# Patient Record
Sex: Male | Born: 1985
Health system: Southern US, Community
[De-identification: ages and names within clinical notes are randomized; demographics above are authoritative.]

---

## 2009-11-27 ENCOUNTER — Emergency Department (HOSPITAL_BASED_OUTPATIENT_CLINIC_OR_DEPARTMENT_OTHER): Admission: EM | Admit: 2009-11-27 | Discharge: 2009-11-27 | Payer: Self-pay | Admitting: Emergency Medicine

## 2011-01-14 ENCOUNTER — Encounter: Payer: Self-pay | Admitting: Family Medicine

## 2011-01-14 ENCOUNTER — Emergency Department (HOSPITAL_BASED_OUTPATIENT_CLINIC_OR_DEPARTMENT_OTHER)
Admission: EM | Admit: 2011-01-14 | Discharge: 2011-01-14 | Disposition: A | Discharge status: Home / Self Care | Payer: Self-pay | Attending: Emergency Medicine | Admitting: Emergency Medicine

## 2011-01-14 DIAGNOSIS — H109 Unspecified conjunctivitis: Secondary | ICD-10-CM | POA: Insufficient documentation

## 2011-01-14 DIAGNOSIS — H5789 Other specified disorders of eye and adnexa: Secondary | ICD-10-CM | POA: Insufficient documentation

## 2011-01-14 DIAGNOSIS — L738 Other specified follicular disorders: Secondary | ICD-10-CM | POA: Insufficient documentation

## 2011-01-14 DIAGNOSIS — F17200 Nicotine dependence, unspecified, uncomplicated: Secondary | ICD-10-CM | POA: Insufficient documentation

## 2011-01-14 NOTE — ED Notes (Signed)
Patient states he came in for eye redness and pain, crusted eye lids in morning. Patient states he also needs some "boils" checked on his legs. Patient vomited on floor in waiting room.

## 2011-01-14 NOTE — ED Notes (Signed)
Pt reports having "real bad allergies". Pt denies blurred vision at this time.

## 2011-01-14 NOTE — ED Notes (Signed)
Pt chief complaint of "eye drainage and being crusty in the mornings with occasional blurred vision and pain". Pt also sts he has had nausea "all day" and finally vomited. Pt observed "drinking large amount of water in the lobby and then vomited immediately after". Pt also sts  "bumps on the inside of my thighs I want checked".

## 2011-01-14 NOTE — ED Provider Notes (Signed)
History   CC: eye discharge  CSN: [id number] Arrival date & time: 01/14/2011 10:51 AM  Chief Complaint  Patient presents with  . Eye Drainage   HPI Well young male presenting with 1 week of eye complaints. He was in his usual state of health prior to the onset of symptoms. He notes a day of having a sore throat, then the subsequent development of bilateral eye discomfort. He describes a.m. "crustiness" and persistent like discharge, with occasional blurry vision. He has not had anything to resolve the symptoms. He also notes some amounts of nausea, including her before in the evaluation, when he had one episode of vomitus. Patient also would like bilateral thigh folliculitis checked out. He denies any fevers, chills, mental status changes, cough, chest pain, shortness of breath, abdominal pain, diarrhea, dysuria throughout the course of illness History reviewed. No pertinent past medical history.  History reviewed. No pertinent past surgical history.  No family history on file.  History  Substance Use Topics  . Smoking status: Current Everyday Smoker    Types: Cigarettes  . Smokeless tobacco: Not on file  . Alcohol Use: No      Review of Systems  Constitutional: Negative for fever and chills.  HENT: Positive for sore throat.   Eyes: Negative for visual disturbance.  Respiratory: Negative for shortness of breath.   Cardiovascular: Negative for chest pain.  Gastrointestinal: Negative for nausea.  Genitourinary: Negative for dysuria.  Musculoskeletal: Negative for myalgias.  Skin: Positive for rash.  Neurological: Negative for headaches.  Psychiatric/Behavioral: Negative.     Physical Exam  BP 113/77  Pulse 55  Temp(Src) 97.8 F (36.6 C) (Oral)  Resp 20  SpO2 100%  Physical Exam  Constitutional: He is oriented to person, place, and time. He appears well-developed and well-nourished.  HENT:  Head: Normocephalic and atraumatic.  Mouth/Throat: Oropharynx is clear and  moist. Mucous membranes are not pale and not dry. Normal dentition. No oropharyngeal exudate.  Eyes: EOM are normal. Pupils are equal, round, and reactive to light. Right eye exhibits discharge. Right eye exhibits no chemosis and no exudate. Left eye exhibits discharge. Left eye exhibits no chemosis and no exudate. Right conjunctiva is injected. Left conjunctiva is injected. No scleral icterus.  Neck: Neck supple.  Cardiovascular: Normal rate and regular rhythm.   Pulmonary/Chest: No respiratory distress.  Abdominal: Soft. There is no tenderness.  Musculoskeletal: He exhibits no edema.  Neurological: He is alert and oriented to person, place, and time.  Skin:       Bilateral folliculitis about inner thighs. No erythema, no gross discharge, no gross signs of infection systemically.  Psychiatric: He has a normal mood and affect.    ED Course  Procedures  MDM Well young male presenting now with one week of bilateral eye discharge and crustiness, consistent with conjunctivitis. The onset of symptoms following a sore throat is further reassuring of this. The patient will have his visual acuities checked. These are normal, he will be discharged with structures use Visine for comfort. Absent fevers, signs of systemic illness, or other focal findings, this is unlikely to be bacterial conjunctivitis. The patient notes no history with a primary care physician, but will given referral for one, with explicit instructions to followup if no improvement in 2-3 days, or if he develops any new concerning symptoms.      Gerhard Munch, MD 01/14/11 1155

## 2011-11-04 ENCOUNTER — Encounter (HOSPITAL_BASED_OUTPATIENT_CLINIC_OR_DEPARTMENT_OTHER): Payer: Self-pay | Admitting: Family Medicine

## 2011-11-04 ENCOUNTER — Emergency Department (HOSPITAL_BASED_OUTPATIENT_CLINIC_OR_DEPARTMENT_OTHER)
Admission: EM | Admit: 2011-11-04 | Discharge: 2011-11-04 | Disposition: A | Discharge status: Home / Self Care | Payer: Self-pay | Attending: Emergency Medicine | Admitting: Emergency Medicine

## 2011-11-04 DIAGNOSIS — R112 Nausea with vomiting, unspecified: Secondary | ICD-10-CM | POA: Insufficient documentation

## 2011-11-04 DIAGNOSIS — F17200 Nicotine dependence, unspecified, uncomplicated: Secondary | ICD-10-CM | POA: Insufficient documentation

## 2011-11-04 DIAGNOSIS — R109 Unspecified abdominal pain: Secondary | ICD-10-CM | POA: Insufficient documentation

## 2011-11-04 DIAGNOSIS — K529 Noninfective gastroenteritis and colitis, unspecified: Secondary | ICD-10-CM

## 2011-11-04 LAB — DIFFERENTIAL
Basophils Relative: 0 % (ref 0–1)
Lymphocytes Relative: 44 % (ref 12–46)
Lymphs Abs: 2.2 10*3/uL (ref 0.7–4.0)
Monocytes Absolute: 0.5 10*3/uL (ref 0.1–1.0)
Monocytes Relative: 11 % (ref 3–12)
Neutro Abs: 2.2 10*3/uL (ref 1.7–7.7)
Neutrophils Relative %: 43 % (ref 43–77)

## 2011-11-04 LAB — CBC
HCT: 42.7 % (ref 39.0–52.0)
Hemoglobin: 15.3 g/dL (ref 13.0–17.0)
RBC: 4.9 MIL/uL (ref 4.22–5.81)
WBC: 5 10*3/uL (ref 4.0–10.5)

## 2011-11-04 LAB — URINALYSIS, ROUTINE W REFLEX MICROSCOPIC
Glucose, UA: NEGATIVE mg/dL
Ketones, ur: NEGATIVE mg/dL
Leukocytes, UA: NEGATIVE
Nitrite: NEGATIVE
Specific Gravity, Urine: 1.024 (ref 1.005–1.030)
pH: 7.5 (ref 5.0–8.0)

## 2011-11-04 LAB — COMPREHENSIVE METABOLIC PANEL
Albumin: 3.7 g/dL (ref 3.5–5.2)
Alkaline Phosphatase: 61 U/L (ref 39–117)
BUN: 10 mg/dL (ref 6–23)
CO2: 32 mEq/L (ref 19–32)
Chloride: 103 mEq/L (ref 96–112)
Creatinine, Ser: 0.9 mg/dL (ref 0.50–1.35)
GFR calc non Af Amer: >90 mL/min (ref >90)
Potassium: 3.8 mEq/L (ref 3.5–5.1)
Total Bilirubin: 0.4 mg/dL (ref 0.3–1.2)

## 2011-11-04 MED ORDER — SODIUM CHLORIDE 0.9 % IV SOLN
Freq: Once | INTRAVENOUS | Status: AC
  Administered 2011-11-04: 10:00:00 via INTRAVENOUS

## 2011-11-04 MED ORDER — ONDANSETRON HCL 4 MG/2ML IJ SOLN
4.0000 mg | Freq: Once | INTRAMUSCULAR | Status: AC
  Administered 2011-11-04: 4 mg via INTRAVENOUS
  Filled 2011-11-04: qty 2

## 2011-11-04 MED ORDER — KETOROLAC TROMETHAMINE 30 MG/ML IJ SOLN
30.0000 mg | Freq: Once | INTRAMUSCULAR | Status: AC
  Administered 2011-11-04: 30 mg via INTRAVENOUS
  Filled 2011-11-04: qty 1

## 2011-11-04 NOTE — ED Provider Notes (Signed)
History     CSN: [id number]  Arrival date & time 11/04/11  4098   First MD Initiated Contact with Patient 11/04/11 [phone]      Chief Complaint  Patient presents with  . Abdominal Pain    (Consider location/radiation/quality/duration/timing/severity/associated sxs/prior treatment) HPI Comments: Woke this am with n/v, abd cramping.  Friend's girlfriend is sick with the same.  He denies fevers or chills.  No urinary complaints.  Patient is a 26 y.o. male presenting with abdominal pain. The history is provided by the patient.  Abdominal Pain The primary symptoms of the illness include abdominal pain, nausea and vomiting. The primary symptoms of the illness do not include fever, diarrhea or dysuria. Episode onset: this morning. The onset of the illness was sudden. The problem has been gradually worsening.  The patient has not had a change in bowel habit. Symptoms associated with the illness do not include chills, constipation, urgency, frequency or back pain. Associated medical issues comments: none.    History reviewed. No pertinent past medical history.  History reviewed. No pertinent past surgical history.  No family history on file.  History  Substance Use Topics  . Smoking status: Current Everyday Smoker    Types: Cigarettes  . Smokeless tobacco: Not on file  . Alcohol Use: No      Review of Systems  Constitutional: Negative for fever and chills.  Gastrointestinal: Positive for nausea, vomiting and abdominal pain. Negative for diarrhea and constipation.  Genitourinary: Negative for dysuria, urgency and frequency.  Musculoskeletal: Negative for back pain.  All other systems reviewed and are negative.    Allergies  Review of patient's allergies indicates no known allergies.  Home Medications  No current outpatient prescriptions on file.  BP 119/69  Pulse 55  Temp 97.6 F (36.4 C) (Oral)  Resp 16  Ht 5\' 10"  (1.778 m)  Wt 175 lb (79.379 kg)  BMI 25.11 kg/m2   SpO2 99%  Physical Exam  Nursing note and vitals reviewed. Constitutional: He is oriented to person, place, and time. He appears well-developed and well-nourished. No distress.  HENT:  Head: Normocephalic and atraumatic.  Neck: Normal range of motion. Neck supple.  Cardiovascular: Normal rate.   No murmur heard. Pulmonary/Chest: Effort normal and breath sounds normal.  Abdominal: Soft. Bowel sounds are normal. He exhibits no distension.       There is mild ttp in all four quadrants without rebound or guarding.  Neurological: He is alert and oriented to person, place, and time.  Skin: Skin is warm and dry. He is not diaphoretic.    ED Course  Procedures (including critical care time)   Labs Reviewed  URINALYSIS, ROUTINE W REFLEX MICROSCOPIC   No results found.   No diagnosis found.    MDM  Labs look okay.  Symptoms most likely viral in nature.  Will discharge with time, increased fluids.  Return prn.          Geoffery Lyons, MD 11/04/11 1055

## 2011-11-04 NOTE — ED Notes (Signed)
Pt c/o lower abdominal pain since this morning and 1 episode of vomiting. Pt sts girlfriend has also been sick.

## 2011-11-04 NOTE — Discharge Instructions (Signed)
Viral Gastroenteritis Viral gastroenteritis is also known as stomach flu. This condition affects the stomach and intestinal tract. It can cause sudden diarrhea and vomiting. The illness typically lasts 3 to 8 days. Most people develop an immune response that eventually gets rid of the virus. While this natural response develops, the virus can make you quite ill. CAUSES  Many different viruses can cause gastroenteritis, such as rotavirus or noroviruses. You can catch one of these viruses by consuming contaminated food or water. You may also catch a virus by sharing utensils or other personal items with an infected person or by touching a contaminated surface. SYMPTOMS  The most common symptoms are diarrhea and vomiting. These problems can cause a severe loss of body fluids (dehydration) and a body salt (electrolyte) imbalance. Other symptoms may include:  Fever.   Headache.   Fatigue.   Abdominal pain.  DIAGNOSIS  Your caregiver can usually diagnose viral gastroenteritis based on your symptoms and a physical exam. A stool sample may also be taken to test for the presence of viruses or other infections. TREATMENT  This illness typically goes away on its own. Treatments are aimed at rehydration. The most serious cases of viral gastroenteritis involve vomiting so severely that you are not able to keep fluids down. In these cases, fluids must be given through an intravenous line (IV). HOME CARE INSTRUCTIONS   Drink enough fluids to keep your urine clear or pale yellow. Drink small amounts of fluids frequently and increase the amounts as tolerated.   Ask your caregiver for specific rehydration instructions.   Avoid:   Foods high in sugar.   Alcohol.   Carbonated drinks.   Tobacco.   Juice.   Caffeine drinks.   Extremely hot or cold fluids.   Fatty, greasy foods.   Too much intake of anything at one time.   Dairy products until 24 to 48 hours after diarrhea stops.   You may  consume probiotics. Probiotics are active cultures of beneficial bacteria. They may lessen the amount and number of diarrheal stools in adults. Probiotics can be found in yogurt with active cultures and in supplements.   Wash your hands well to avoid spreading the virus.   Only take over-the-counter or prescription medicines for pain, discomfort, or fever as directed by your caregiver. Do not give aspirin to children. Antidiarrheal medicines are not recommended.   Ask your caregiver if you should continue to take your regular prescribed and over-the-counter medicines.   Keep all follow-up appointments as directed by your caregiver.  SEEK IMMEDIATE MEDICAL CARE IF:   You are unable to keep fluids down.   You do not urinate at least once every 6 to 8 hours.   You develop shortness of breath.   You notice blood in your stool or vomit. This may look like coffee grounds.   You have abdominal pain that increases or is concentrated in one small area (localized).   You have persistent vomiting or diarrhea.   You have a fever.   The patient is a child younger than 3 months, and he or she has a fever.   The patient is a child older than 3 months, and he or she has a fever and persistent symptoms.   The patient is a child older than 3 months, and he or she has a fever and symptoms suddenly get worse.   The patient is a baby, and he or she has no tears when crying.  MAKE SURE YOU:     Understand these instructions.   Will watch your condition.   Will get help right away if you are not doing well or get worse.  Document Released: 05/03/2005 Document Revised: 04/22/2011 Document Reviewed: 02/17/2011 [employer] Patient Information 2012 [employer]. 

## 2012-01-27 ENCOUNTER — Encounter (HOSPITAL_BASED_OUTPATIENT_CLINIC_OR_DEPARTMENT_OTHER): Payer: Self-pay | Admitting: *Deleted

## 2012-01-27 ENCOUNTER — Emergency Department (HOSPITAL_BASED_OUTPATIENT_CLINIC_OR_DEPARTMENT_OTHER)
Admission: EM | Admit: 2012-01-27 | Discharge: 2012-01-27 | Disposition: A | Discharge status: Home / Self Care | Payer: Self-pay | Attending: Emergency Medicine | Admitting: Emergency Medicine

## 2012-01-27 DIAGNOSIS — R05 Cough: Secondary | ICD-10-CM | POA: Insufficient documentation

## 2012-01-27 DIAGNOSIS — R0981 Nasal congestion: Secondary | ICD-10-CM

## 2012-01-27 DIAGNOSIS — R059 Cough, unspecified: Secondary | ICD-10-CM | POA: Insufficient documentation

## 2012-01-27 DIAGNOSIS — R0982 Postnasal drip: Secondary | ICD-10-CM

## 2012-01-27 DIAGNOSIS — J3489 Other specified disorders of nose and nasal sinuses: Secondary | ICD-10-CM | POA: Insufficient documentation

## 2012-01-27 MED ORDER — IBUPROFEN 800 MG PO TABS
800.0000 mg | ORAL_TABLET | Freq: Once | ORAL | Status: AC
  Administered 2012-01-27: 800 mg via ORAL
  Filled 2012-01-27: qty 1

## 2012-01-27 MED ORDER — FLUTICASONE PROPIONATE 50 MCG/ACT NA SUSP: 2.0000 | Freq: Every day | NASAL | Status: DC

## 2012-01-27 NOTE — ED Provider Notes (Addendum)
History     CSN: [id number]  Arrival date & time 01/27/12  3086   First MD Initiated Contact with Patient 01/27/12 0150      Chief Complaint  Patient presents with  . Nasal Congestion  . Cough    (Consider location/radiation/quality/duration/timing/severity/associated sxs/prior treatment) Patient is a 26 y.o. male presenting with cough. The history is provided by the patient. No language interpreter was used.  Cough This is a new problem. The current episode started yesterday. The problem occurs hourly. The problem has not changed since onset.The cough is non-productive. There has been no fever. Associated symptoms include rhinorrhea. Pertinent negatives include no chest pain, no chills, no sweats, no weight loss, no ear congestion, no ear pain, no headaches, no sore throat, no myalgias, no shortness of breath and no wheezing. He has tried nothing for the symptoms. The treatment provided no relief. Risk factors: none. He is a smoker. His past medical history does not include COPD.    History reviewed. No pertinent past medical history.  History reviewed. No pertinent past surgical history.  History reviewed. No pertinent family history.  History  Substance Use Topics  . Smoking status: Current Every Day Smoker    Types: Cigarettes  . Smokeless tobacco: Not on file  . Alcohol Use: No      Review of Systems  Constitutional: Negative for fever, chills, weight loss and fatigue.  HENT: Positive for congestion, rhinorrhea and postnasal drip. Negative for ear pain, sore throat, neck pain and neck stiffness.   Respiratory: Positive for cough. Negative for shortness of breath and wheezing.   Cardiovascular: Negative for chest pain and leg swelling.  Musculoskeletal: Negative for myalgias.  Neurological: Negative for headaches.  All other systems reviewed and are negative.    Allergies  Review of patient's allergies indicates no known allergies.  Home Medications  No current  outpatient prescriptions on file.  BP 124/66  Pulse 61  Temp 97.5 F (36.4 C) (Oral)  Resp 18  Ht 5\' 11"  (1.803 m)  Wt 175 lb (79.379 kg)  BMI 24.41 kg/m2  SpO2 99%  Physical Exam  Constitutional: He is oriented to person, place, and time. He appears well-developed and well-nourished. No distress.  HENT:  Head: Normocephalic and atraumatic.       Nasal turbinate boggy cobblestoning of the throat consistent with PND  Eyes: Conjunctivae normal are normal. Pupils are equal, round, and reactive to light.  Neck: Normal range of motion. Neck supple.  Cardiovascular: Normal rate and regular rhythm.   Pulmonary/Chest: Effort normal and breath sounds normal. No respiratory distress. He has no wheezes. He has no rales.  Abdominal: Soft. Bowel sounds are normal. There is no tenderness.  Musculoskeletal: Normal range of motion.  Neurological: He is alert and oriented to person, place, and time.  Skin: Skin is warm and dry.  Psychiatric: He has a normal mood and affect.    ED Course  Procedures (including critical care time)  Labs Reviewed - No data to display No results found.   No diagnosis found.    MDM  Will give Ibuprofen for nasal pain and recommedned mucinex DM and breath right strips.  Will provide rx for flonase        Chase Noah K Oleda Borski-Rasch, MD 01/27/12 0200  Chase Parrillo K Bret Vanessen-Rasch, MD 01/27/12 0200

## 2012-01-27 NOTE — ED Notes (Signed)
Pt c/o nasal congestion and cough all week, no relief from OTC meds. Pt denies fever.

## 2012-07-30 ENCOUNTER — Encounter (HOSPITAL_BASED_OUTPATIENT_CLINIC_OR_DEPARTMENT_OTHER): Payer: Self-pay

## 2012-07-30 ENCOUNTER — Emergency Department (HOSPITAL_BASED_OUTPATIENT_CLINIC_OR_DEPARTMENT_OTHER)
Admission: EM | Admit: 2012-07-30 | Discharge: 2012-07-30 | Disposition: A | Discharge status: Home / Self Care | Payer: Self-pay | Attending: Emergency Medicine | Admitting: Emergency Medicine

## 2012-07-30 DIAGNOSIS — IMO0002 Reserved for concepts with insufficient information to code with codable children: Secondary | ICD-10-CM | POA: Insufficient documentation

## 2012-07-30 DIAGNOSIS — M549 Dorsalgia, unspecified: Secondary | ICD-10-CM

## 2012-07-30 DIAGNOSIS — M545 Low back pain, unspecified: Secondary | ICD-10-CM | POA: Insufficient documentation

## 2012-07-30 DIAGNOSIS — F17200 Nicotine dependence, unspecified, uncomplicated: Secondary | ICD-10-CM | POA: Insufficient documentation

## 2012-07-30 MED ORDER — CYCLOBENZAPRINE HCL 10 MG PO TABS: 10.0000 mg | ORAL_TABLET | Freq: Three times a day (TID) | ORAL | Status: DC | PRN

## 2012-07-30 MED ORDER — IBUPROFEN 800 MG PO TABS: 800.0000 mg | ORAL_TABLET | Freq: Three times a day (TID) | ORAL | Status: DC | PRN

## 2012-07-30 NOTE — ED Notes (Addendum)
Pt states that he has bilateral lower back pain, has not taken anything for his pain at home because he does not have any medication to take.  Pain increases with movement.  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

## 2012-07-30 NOTE — ED Provider Notes (Signed)
History     CSN: [id number]  Arrival date & time 07/30/12  0908   First MD Initiated Contact with Patient 07/30/12 [phone]      Chief Complaint  Patient presents with  . Back Pain    (Consider location/radiation/quality/duration/timing/severity/associated sxs/prior treatment) Patient is a 27 y.o. male presenting with back pain.  Back Pain  Pt reports moderate to severe aching lower back pain off and on for about 6 months. No injury. No fever, incontinence. Pain does not radiate into legs, no numbness or weakness. Has not taken any thing at home. Pain worse with certain movements and playing basketball.   History reviewed. No pertinent past medical history.  History reviewed. No pertinent past surgical history.  History reviewed. No pertinent family history.  History  Substance Use Topics  . Smoking status: Current Every Day Smoker -- 0.50 packs/day for 5 years    Types: Cigarettes  . Smokeless tobacco: Never Used  . Alcohol Use: Yes     Comment: social      Review of Systems  Musculoskeletal: Positive for back pain.   All other systems reviewed and are negative except as noted in HPI.   Allergies  Review of patient's allergies indicates no known allergies.  Home Medications   Current Outpatient Rx  Name  Route  Sig  Dispense  Refill  . fluticasone (FLONASE) 50 MCG/ACT nasal spray   Nasal   Place 2 sprays into the nose daily.   16 g   0     BP 137/72  Pulse 66  Temp(Src) 97.9 F (36.6 C) (Oral)  Resp 20  Ht 5\' 10"  (1.778 m)  Wt 180 lb (81.647 kg)  BMI 25.83 kg/m2  SpO2 99%  Physical Exam  Nursing note and vitals reviewed. Constitutional: He is oriented to person, place, and time. He appears well-developed and well-nourished.  HENT:  Head: Normocephalic and atraumatic.  Eyes: EOM are normal. Pupils are equal, round, and reactive to light.  Neck: Normal range of motion. Neck supple.  Cardiovascular: Normal rate, normal heart sounds and intact distal  pulses.   Pulmonary/Chest: Effort normal and breath sounds normal.  Abdominal: Bowel sounds are normal. He exhibits no distension. There is no tenderness.  Musculoskeletal: Normal range of motion. He exhibits tenderness (lumbar paraspinal muscle spasm/soreness). He exhibits no edema.  Neurological: He is alert and oriented to person, place, and time. He has normal strength. He displays normal reflexes. No cranial nerve deficit or sensory deficit.  Skin: Skin is warm and dry. No rash noted.  Psychiatric: He has a normal mood and affect.    ED Course  Procedures (including critical care time)  Labs Reviewed - No data to display No results found.   1. Back pain       MDM  Back pain, ongoing for months. No Red Flags. Advised NSAID, muscle relaxer and followup with Dr. Pearletha Forge.         Suzanne Garbers B. Bernette Mayers, MD 07/30/12 [phone]

## 2012-08-18 ENCOUNTER — Encounter (HOSPITAL_BASED_OUTPATIENT_CLINIC_OR_DEPARTMENT_OTHER): Payer: Self-pay | Admitting: *Deleted

## 2012-08-18 ENCOUNTER — Emergency Department (HOSPITAL_BASED_OUTPATIENT_CLINIC_OR_DEPARTMENT_OTHER)
Admission: EM | Admit: 2012-08-18 | Discharge: 2012-08-18 | Disposition: A | Discharge status: Home / Self Care | Payer: Self-pay | Attending: Emergency Medicine | Admitting: Emergency Medicine

## 2012-08-18 DIAGNOSIS — F17200 Nicotine dependence, unspecified, uncomplicated: Secondary | ICD-10-CM | POA: Insufficient documentation

## 2012-08-18 DIAGNOSIS — M109 Gout, unspecified: Secondary | ICD-10-CM | POA: Insufficient documentation

## 2012-08-18 MED ORDER — INDOMETHACIN 25 MG PO CAPS: 25.0000 mg | ORAL_CAPSULE | Freq: Three times a day (TID) | ORAL | Status: DC | PRN

## 2012-08-18 MED ORDER — HYDROCODONE-ACETAMINOPHEN 5-325 MG PO TABS: 2.0000 | ORAL_TABLET | ORAL | Status: DC | PRN

## 2012-08-18 NOTE — ED Notes (Signed)
MD at bedside. 

## 2012-08-18 NOTE — ED Provider Notes (Signed)
History     CSN: [id number]  Arrival date & time 08/18/12  1026   First MD Initiated Contact with Patient 08/18/12 1041      Chief Complaint  Patient presents with  . Wrist Pain  . Ankle Pain    (Consider location/radiation/quality/duration/timing/severity/associated sxs/prior treatment) HPI Comments: Patient presents with complaints of pain, swelling in the right foot near the big toe.  No injury or trauma.  No fevers or chills.  Never had this before.  Worse with movement, nothing seems to relieve the pain.  The history is provided by the patient.    History reviewed. No pertinent past medical history.  History reviewed. No pertinent past surgical history.  History reviewed. No pertinent family history.  History  Substance Use Topics  . Smoking status: Current Every Day Smoker -- 0.50 packs/day for 5 years    Types: Cigarettes  . Smokeless tobacco: Never Used  . Alcohol Use: Yes     Comment: social      Review of Systems  All other systems reviewed and are negative.    Allergies  Review of patient's allergies indicates no known allergies.  Home Medications   Current Outpatient Rx  Name  Route  Sig  Dispense  Refill  . cyclobenzaprine (FLEXERIL) 10 MG tablet   Oral   Take 1 tablet (10 mg total) by mouth 3 (three) times daily as needed for muscle spasms.   30 tablet   0   . fluticasone (FLONASE) 50 MCG/ACT nasal spray   Nasal   Place 2 sprays into the nose daily.   16 g   0   . ibuprofen (ADVIL,MOTRIN) 800 MG tablet   Oral   Take 1 tablet (800 mg total) by mouth every 8 (eight) hours as needed for pain.   30 tablet   0     BP 127/81  Pulse 69  Temp(Src) 97.1 F (36.2 C) (Oral)  Resp 20  Ht 5\' 10"  (1.778 m)  Wt 180 lb (81.647 kg)  BMI 25.83 kg/m2  SpO2 100%  Physical Exam  Nursing note and vitals reviewed. Constitutional: He is oriented to person, place, and time. He appears well-developed and well-nourished. No distress.  HENT:  Head:  Normocephalic and atraumatic.  Mouth/Throat: Oropharynx is clear and moist.  Neck: Normal range of motion. Neck supple.  Musculoskeletal: Normal range of motion.  The right mtp joint is swollen, erythematous, warm, and there is pain with range of motion.  The right wrist is also ttp over the ulnar aspect but there is no warmth or erythema or swelling.  Neurological: He is alert and oriented to person, place, and time.  Skin: Skin is warm and dry. He is not diaphoretic.    ED Course  Procedures (including critical care time)  Labs Reviewed - No data to display No results found.   No diagnosis found.    MDM  This appears clinically to be gout.  Will treat with indocin, pain meds.  Work excuse given.  Return prn.        Geoffery Lyons, MD 08/18/12 1058

## 2012-08-18 NOTE — ED Notes (Signed)
Reports pain in right foot on outer part of foot near great toe and pain in right wrist denies injury pt noted to move all extremities

## 2013-07-30 ENCOUNTER — Encounter (HOSPITAL_BASED_OUTPATIENT_CLINIC_OR_DEPARTMENT_OTHER): Payer: Self-pay | Admitting: Emergency Medicine

## 2013-07-30 ENCOUNTER — Emergency Department (HOSPITAL_BASED_OUTPATIENT_CLINIC_OR_DEPARTMENT_OTHER)
Admission: EM | Admit: 2013-07-30 | Discharge: 2013-07-30 | Disposition: A | Discharge status: Home / Self Care | Payer: Self-pay | Attending: Emergency Medicine | Admitting: Emergency Medicine

## 2013-07-30 DIAGNOSIS — J020 Streptococcal pharyngitis: Secondary | ICD-10-CM | POA: Insufficient documentation

## 2013-07-30 DIAGNOSIS — IMO0002 Reserved for concepts with insufficient information to code with codable children: Secondary | ICD-10-CM | POA: Insufficient documentation

## 2013-07-30 DIAGNOSIS — F17200 Nicotine dependence, unspecified, uncomplicated: Secondary | ICD-10-CM | POA: Insufficient documentation

## 2013-07-30 LAB — RAPID STREP SCREEN (MED CTR MEBANE ONLY): Streptococcus, Group A Screen (Direct): POSITIVE — AB

## 2013-07-30 MED ORDER — PENICILLIN G BENZATHINE 1200000 UNIT/2ML IM SUSP
1.2000 10*6.[IU] | Freq: Once | INTRAMUSCULAR | Status: AC
Start: 2013-07-30 — End: 2013-07-30
  Administered 2013-07-30: 1.2 10*6.[IU] via INTRAMUSCULAR
  Filled 2013-07-30: qty 2

## 2013-07-30 MED ORDER — LIDOCAINE VISCOUS 2 % MT SOLN
15.0000 mL | Freq: Once | OROMUCOSAL | Status: AC
  Administered 2013-07-30: 15 mL via OROMUCOSAL
  Filled 2013-07-30: qty 15

## 2013-07-30 NOTE — Discharge Instructions (Signed)
Pharyngitis °Pharyngitis is a sore throat (pharynx). There is redness, pain, and swelling of your throat. °HOME CARE  °· Drink enough fluids to keep your pee (urine) clear or pale yellow. °· Only take medicine as told by your doctor. °· You may get sick again if you do not take medicine as told. Finish your medicines, even if you start to feel better. °· Do not take aspirin. °· Rest. °· Rinse your mouth (gargle) with salt water (½ tsp of salt per 1 qt of water) every 1 2 hours. This will help the pain. °· If you are not at risk for choking, you can suck on hard candy or sore throat lozenges. °GET HELP IF: °· You have large, tender lumps on your neck. °· You have a rash. °· You cough up green, yellow-brown, or bloody spit. °GET HELP RIGHT AWAY IF:  °· You have a stiff neck. °· You drool or cannot swallow liquids. °· You throw up (vomit) or are not able to keep medicine or liquids down. °· You have very bad pain that does not go away with medicine. °· You have problems breathing (not from a stuffy nose). °MAKE SURE YOU:  °· Understand these instructions. °· Will watch your condition. °· Will get help right away if you are not doing well or get worse. °Document Released: 10/20/2007 Document Revised: 02/21/2013 Document Reviewed: 01/08/2013 °[employer]® Patient Information ©2014 [employer]. ° °

## 2013-07-30 NOTE — ED Notes (Signed)
Pt reports fever, sore throat, body aches, that started today.

## 2013-07-30 NOTE — ED Notes (Signed)
Pt denies adverse effects to IM antibiotic administration.

## 2013-07-30 NOTE — ED Provider Notes (Signed)
CSN: [id number]     Arrival date & time 07/30/13  0219 History   First MD Initiated Contact with Patient 07/30/13 0246     Chief Complaint  Patient presents with  . Sore Throat     (Consider location/radiation/quality/duration/timing/severity/associated sxs/prior Treatment) Patient is a 28 y.o. male presenting with pharyngitis. The history is provided by the patient. No language interpreter was used.  Sore Throat This is a new problem. The current episode started yesterday. The problem occurs constantly. The problem has not changed since onset.Pertinent negatives include no chest pain, no abdominal pain, no headaches and no shortness of breath. Nothing aggravates the symptoms. Nothing relieves the symptoms. He has tried nothing for the symptoms. The treatment provided no relief.    History reviewed. No pertinent past medical history. History reviewed. No pertinent past surgical history. History reviewed. No pertinent family history. History  Substance Use Topics  . Smoking status: Current Every Day Smoker -- 0.50 packs/day for 5 years    Types: Cigarettes  . Smokeless tobacco: Never Used  . Alcohol Use: Yes     Comment: social    Review of Systems  Constitutional: Positive for fever.  HENT: Positive for sore throat. Negative for drooling, trouble swallowing and voice change.   Respiratory: Negative for shortness of breath.   Cardiovascular: Negative for chest pain.  Gastrointestinal: Negative for abdominal pain.  Neurological: Negative for headaches.  All other systems reviewed and are negative.      Allergies  Review of patient's allergies indicates no known allergies.  Home Medications   Current Outpatient Rx  Name  Route  Sig  Dispense  Refill  . ibuprofen (ADVIL,MOTRIN) 800 MG tablet   Oral   Take 1 tablet (800 mg total) by mouth every 8 (eight) hours as needed for pain.   30 tablet   0   . cyclobenzaprine (FLEXERIL) 10 MG tablet   Oral   Take 1 tablet (10  mg total) by mouth 3 (three) times daily as needed for muscle spasms.   30 tablet   0   . EXPIRED: fluticasone (FLONASE) 50 MCG/ACT nasal spray   Nasal   Place 2 sprays into the nose daily.   16 g   0   . HYDROcodone-acetaminophen (NORCO) 5-325 MG per tablet   Oral   Take 2 tablets by mouth every 4 (four) hours as needed for pain.   12 tablet   0   . indomethacin (INDOCIN) 25 MG capsule   Oral   Take 1 capsule (25 mg total) by mouth 3 (three) times daily as needed.   15 capsule   0    BP 129/76  Pulse 78  Temp(Src) 100.5 F (38.1 C) (Oral)  Resp 23  Ht 5\' 10"  (1.778 m)  Wt 205 lb (92.987 kg)  BMI 29.41 kg/m2  SpO2 99% Physical Exam  Constitutional: He is oriented to person, place, and time. He appears well-developed and well-nourished. No distress.  HENT:  Head: Normocephalic and atraumatic.  Mouth/Throat: Oropharynx is clear and moist. No oropharyngeal exudate.  Eyes: Conjunctivae are normal. Pupils are equal, round, and reactive to light.  Neck: Normal range of motion. Neck supple. No tracheal deviation present.  Cardiovascular: Normal rate, regular rhythm and intact distal pulses.   Pulmonary/Chest: Effort normal and breath sounds normal. No stridor. He has no wheezes. He has no rales.  Abdominal: Soft. Bowel sounds are normal. There is no tenderness. There is no rebound and no guarding.  Musculoskeletal: Normal  range of motion.  Lymphadenopathy:    He has no cervical adenopathy.  Neurological: He is alert and oriented to person, place, and time.  Skin: Skin is warm and dry.  Psychiatric: He has a normal mood and affect.    ED Course  Procedures (including critical care time) Labs Review Labs Reviewed  RAPID STREP SCREEN - Abnormal; Notable for the following:    Streptococcus, Group A Screen (Direct) POSITIVE (*)    All other components within normal limits   Imaging Review No results found.   EKG Interpretation None      MDM   Final diagnoses:   None    Strep pharyngitis treated with IM pCN.  Return for persistent fevers, inability to swallowing shortness of breath, drooling or any concerns    Refugio Mcconico K Kaamil Morefield-Rasch, MD 07/30/13 [id number]

## 2013-07-31 ENCOUNTER — Encounter (HOSPITAL_BASED_OUTPATIENT_CLINIC_OR_DEPARTMENT_OTHER): Payer: Self-pay | Admitting: Emergency Medicine

## 2013-07-31 ENCOUNTER — Emergency Department (HOSPITAL_BASED_OUTPATIENT_CLINIC_OR_DEPARTMENT_OTHER)
Admission: EM | Admit: 2013-07-31 | Discharge: 2013-07-31 | Disposition: A | Discharge status: Home / Self Care | Payer: Self-pay | Attending: Emergency Medicine | Admitting: Emergency Medicine

## 2013-07-31 DIAGNOSIS — IMO0002 Reserved for concepts with insufficient information to code with codable children: Secondary | ICD-10-CM | POA: Insufficient documentation

## 2013-07-31 DIAGNOSIS — F17200 Nicotine dependence, unspecified, uncomplicated: Secondary | ICD-10-CM | POA: Insufficient documentation

## 2013-07-31 DIAGNOSIS — Z79899 Other long term (current) drug therapy: Secondary | ICD-10-CM | POA: Insufficient documentation

## 2013-07-31 DIAGNOSIS — J020 Streptococcal pharyngitis: Secondary | ICD-10-CM | POA: Insufficient documentation

## 2013-07-31 MED ORDER — OXYCODONE-ACETAMINOPHEN 5-325 MG PO TABS: 1.0000 | ORAL_TABLET | ORAL | Status: DC | PRN

## 2013-07-31 MED ORDER — PREDNISONE 50 MG PO TABS
60.0000 mg | ORAL_TABLET | Freq: Once | ORAL | Status: AC
  Administered 2013-07-31: 60 mg via ORAL
  Filled 2013-07-31 (×2): qty 1

## 2013-07-31 MED ORDER — PREDNISONE 10 MG PO TABS: 20.0000 mg | ORAL_TABLET | Freq: Every day | ORAL | Status: DC

## 2013-07-31 NOTE — Discharge Instructions (Signed)
Strep Throat  Strep throat is an infection of the throat. It is caused by a germ. Strep throat spreads from person to person by coughing, sneezing, or close contact.  HOME CARE  · Rinse your mouth (gargle) with warm salt water (1 teaspoon salt in 1 cup of water). Do this 3 to 4 times per day or as needed for comfort.  · Family members with a sore throat or fever should see a doctor.  · Make sure everyone in your house washes their hands well.  · Do not share food, drinking cups, or personal items.  · Eat soft foods until your sore throat gets better.  · Drink enough water and fluids to keep your pee (urine) clear or pale yellow.  · Rest.  · Stay home from school, daycare, or work until you have taken medicine for 24 hours.  · Only take medicine as told by your doctor.  · Take your medicine as told. Finish it even if you start to feel better.  GET HELP RIGHT AWAY IF:   · You have new problems, such as throwing up (vomiting) or bad headaches.  · You have a stiff or painful neck, chest pain, trouble breathing, or trouble swallowing.  · You have very bad throat pain, drooling, or changes in your voice.  · Your neck puffs up (swells) or gets red and tender.  · You have a fever.  · You are very tired, your mouth is dry, or you are peeing less than normal.  · You cannot wake up completely.  · You get a rash, cough, or earache.  · You have green, yellow-brown, or bloody spit.  · Your pain does not get better with medicine.  MAKE SURE YOU:   · Understand these instructions.  · Will watch your condition.  · Will get help right away if you are not doing well or get worse.  Document Released: 10/20/2007 Document Revised: 07/26/2011 Document Reviewed: 07/02/2010  ExitCare® Patient Information ©2014 [employer].

## 2013-07-31 NOTE — ED Provider Notes (Signed)
CSN: [id number]     Arrival date & time 07/31/13  4098 History   First MD Initiated Contact with Patient 07/31/13 [phone]     Chief Complaint  Patient presents with  . Sore Throat     (Consider location/radiation/quality/duration/timing/severity/associated sxs/prior Treatment) HPI  28 year old male seen here March 16 and diagnosed with strep throat. He was given IM antibiotics. He presents today stating that he is taking Tylenol without pain relief. He continues to have severe pain in his throat and his not taking solid food. He is taking liquids. He denies lightheadedness but does feel generally weak. He has been voiding as usual. He has not noticed any difficulty speaking or swallowing. He has no significant prior medical history. He has been afebrile since being here.  History reviewed. No pertinent past medical history. History reviewed. No pertinent past surgical history. History reviewed. No pertinent family history. History  Substance Use Topics  . Smoking status: Current Every Day Smoker -- 0.50 packs/day for 5 years    Types: Cigarettes  . Smokeless tobacco: Never Used  . Alcohol Use: Yes     Comment: social    Review of Systems  Constitutional: Positive for appetite change.  HENT: Positive for sore throat.   All other systems reviewed and are negative.      Allergies  Review of patient's allergies indicates no known allergies.  Home Medications   Current Outpatient Rx  Name  Route  Sig  Dispense  Refill  . cyclobenzaprine (FLEXERIL) 10 MG tablet   Oral   Take 1 tablet (10 mg total) by mouth 3 (three) times daily as needed for muscle spasms.   30 tablet   0   . EXPIRED: fluticasone (FLONASE) 50 MCG/ACT nasal spray   Nasal   Place 2 sprays into the nose daily.   16 g   0   . HYDROcodone-acetaminophen (NORCO) 5-325 MG per tablet   Oral   Take 2 tablets by mouth every 4 (four) hours as needed for pain.   12 tablet   0   . ibuprofen (ADVIL,MOTRIN) 800 MG  tablet   Oral   Take 1 tablet (800 mg total) by mouth every 8 (eight) hours as needed for pain.   30 tablet   0   . indomethacin (INDOCIN) 25 MG capsule   Oral   Take 1 capsule (25 mg total) by mouth 3 (three) times daily as needed.   15 capsule   0    BP 131/76  Pulse 85  Temp(Src) 98.5 F (36.9 C) (Oral)  Resp 16  Ht 5\' 10"  (1.778 m)  Wt 205 lb (92.987 kg)  BMI 29.41 kg/m2  SpO2 98% Physical Exam  Nursing note and vitals reviewed. Constitutional: He is oriented to person, place, and time. He appears well-developed and well-nourished.  HENT:  Head: Normocephalic and atraumatic.  Right Ear: External ear normal.  Left Ear: External ear normal.  Nose: Nose normal.  Bilateral tonsillar vesicles and exudate with no lateralized swelling noted.  Eyes: Conjunctivae and EOM are normal. Pupils are equal, round, and reactive to light.  Neck: Normal range of motion. Neck supple.  Cardiovascular: Normal rate and regular rhythm.   Pulmonary/Chest: Effort normal.  Abdominal: Soft.  Musculoskeletal: Normal range of motion.  Neurological: He is alert and oriented to person, place, and time.  Skin: Skin is warm and dry.  Psychiatric: He has a normal mood and affect. His behavior is normal. Judgment and thought content normal.  ED Course  Procedures (including critical care time) Labs Review Labs Reviewed - No data to display Imaging Review No results found.   EKG Interpretation None      MDM   Final diagnoses:  None   Patient with continued throat pain since being treated for strep pharyngitis. Plan prednisone and short term Percocet numbering 6. Patient is given work note for 2 days.  Hilario Quarryanielle S Meenakshi Sazama, MD 07/31/13 [phone]

## 2013-07-31 NOTE — ED Notes (Signed)
Pt amb to room 1 with quick steady gait in nad. Pt reports 3 days of sore throat, seen here 2 days ago with + strep and given pcn im, cont with throat pain.

## 2013-10-13 ENCOUNTER — Encounter (HOSPITAL_BASED_OUTPATIENT_CLINIC_OR_DEPARTMENT_OTHER): Payer: Self-pay | Admitting: Emergency Medicine

## 2013-10-13 DIAGNOSIS — Z79899 Other long term (current) drug therapy: Secondary | ICD-10-CM | POA: Insufficient documentation

## 2013-10-13 DIAGNOSIS — F17200 Nicotine dependence, unspecified, uncomplicated: Secondary | ICD-10-CM | POA: Insufficient documentation

## 2013-10-13 DIAGNOSIS — K089 Disorder of teeth and supporting structures, unspecified: Secondary | ICD-10-CM | POA: Insufficient documentation

## 2013-10-13 DIAGNOSIS — Z791 Long term (current) use of non-steroidal anti-inflammatories (NSAID): Secondary | ICD-10-CM | POA: Insufficient documentation

## 2013-10-13 DIAGNOSIS — IMO0002 Reserved for concepts with insufficient information to code with codable children: Secondary | ICD-10-CM | POA: Insufficient documentation

## 2013-10-13 NOTE — ED Notes (Signed)
Pt reports right lower toothache - also c/o earache x1 week. States part of a tooth chipped off recently.

## 2013-10-14 ENCOUNTER — Emergency Department (HOSPITAL_BASED_OUTPATIENT_CLINIC_OR_DEPARTMENT_OTHER)
Admission: EM | Admit: 2013-10-14 | Discharge: 2013-10-14 | Disposition: A | Discharge status: Home / Self Care | Payer: Self-pay | Attending: Emergency Medicine | Admitting: Emergency Medicine

## 2013-10-14 DIAGNOSIS — K0889 Other specified disorders of teeth and supporting structures: Secondary | ICD-10-CM

## 2013-10-14 MED ORDER — BUPIVACAINE-EPINEPHRINE (PF) 0.5% -1:200000 IJ SOLN
1.8000 mL | Freq: Once | INTRAMUSCULAR | Status: DC
  Filled 2013-10-14: qty 1.8

## 2013-10-14 MED ORDER — PENICILLIN V POTASSIUM 500 MG PO TABS: 500.0000 mg | ORAL_TABLET | Freq: Four times a day (QID) | ORAL | Status: AC

## 2013-10-14 MED ORDER — HYDROCODONE-ACETAMINOPHEN 5-325 MG PO TABS
1.0000 | ORAL_TABLET | Freq: Once | ORAL | Status: AC
  Administered 2013-10-14: 1 via ORAL
  Filled 2013-10-14: qty 1

## 2013-10-14 MED ORDER — HYDROCODONE-ACETAMINOPHEN 5-325 MG PO TABS: 1.0000 | ORAL_TABLET | Freq: Four times a day (QID) | ORAL | Status: DC | PRN

## 2013-10-14 NOTE — ED Provider Notes (Signed)
CSN: [id number]     Arrival date & time 10/13/13  2304 History   First MD Initiated Contact with Patient 10/14/13 [phone]     Chief Complaint  Patient presents with  . Dental Pain     (Consider location/radiation/quality/duration/timing/severity/associated sxs/prior Treatment) HPI This is a 28 year old male who fractured his right lower second molar while eating several weeks ago. He has had pain at that 2 for the past week. The pain is now radiating into his right ear. It is worse with eating or drinking. He states the pain is moderate to severe. There is no significant swelling associated with it. He has been using Orajel without relief.  History reviewed. No pertinent past medical history. History reviewed. No pertinent past surgical history. History reviewed. No pertinent family history. History  Substance Use Topics  . Smoking status: Current Every Day Smoker -- 0.50 packs/day for 5 years    Types: Cigarettes  . Smokeless tobacco: Never Used  . Alcohol Use: Yes     Comment: social    Review of Systems  All other systems reviewed and are negative.   Allergies  Review of patient's allergies indicates no known allergies.  Home Medications   Prior to Admission medications   Medication Sig Start Date End Date Taking? Authorizing Provider  cetirizine (ZYRTEC) 10 MG tablet Take 10 mg by mouth daily.   Yes Historical Provider, MD  ibuprofen (ADVIL,MOTRIN) 800 MG tablet Take 1 tablet (800 mg total) by mouth every 8 (eight) hours as needed for pain. 07/30/12  Yes Charles B. Bernette Mayers, MD  cyclobenzaprine (FLEXERIL) 10 MG tablet Take 1 tablet (10 mg total) by mouth 3 (three) times daily as needed for muscle spasms. 07/30/12   Charles B. Bernette Mayers, MD  fluticasone [facility]) 50 MCG/ACT nasal spray Place 2 sprays into the nose daily. 01/27/12 01/26/13  April K Palumbo-Rasch, MD  HYDROcodone-acetaminophen (NORCO) 5-325 MG per tablet Take 2 tablets by mouth every 4 (four) hours as needed for pain.  08/18/12   Geoffery Lyons, MD  indomethacin (INDOCIN) 25 MG capsule Take 1 capsule (25 mg total) by mouth 3 (three) times daily as needed. 08/18/12   Geoffery Lyons, MD  oxyCODONE-acetaminophen (PERCOCET/ROXICET) 5-325 MG per tablet Take 1 tablet by mouth every 4 (four) hours as needed for severe pain. 07/31/13   Hilario Quarry, MD  predniSONE (DELTASONE) 10 MG tablet Take 2 tablets (20 mg total) by mouth daily. 07/31/13   Hilario Quarry, MD   BP 131/65  Pulse 82  Temp(Src) 98.7 F (37.1 C) (Oral)  Resp 16  Ht 5\' 10"  (1.778 m)  Wt 205 lb (92.987 kg)  BMI 29.41 kg/m2  SpO2 98%  Physical Exam General: Well-developed, well-nourished male in no acute distress; appearance consistent with age of record HENT: normocephalic; atraumatic; fractured right lower second molar with tenderness to percussion Eyes: pupils equal, round and reactive to light; extraocular muscles intact Neck: supple Heart: regular rate and rhythm Lungs: Normal respiratory effort and excursion Abdomen: soft; nondistended Extremities: No deformity; full range of motion Neurologic: Awake, alert and oriented; motor function intact in all extremities and symmetric; no facial droop Skin: Warm and dry Psychiatric: Normal mood and affect    ED Course  Procedures (including critical care time)  Patient refused a dental block.  MDM      Hanley Seamen, MD 10/14/13 [phone]

## 2013-10-14 NOTE — Discharge Instructions (Signed)

## 2013-10-31 ENCOUNTER — Emergency Department (HOSPITAL_BASED_OUTPATIENT_CLINIC_OR_DEPARTMENT_OTHER)
Admission: EM | Admit: 2013-10-31 | Discharge: 2013-10-31 | Disposition: A | Discharge status: Home / Self Care | Payer: Self-pay | Attending: Emergency Medicine | Admitting: Emergency Medicine

## 2013-10-31 ENCOUNTER — Encounter (HOSPITAL_BASED_OUTPATIENT_CLINIC_OR_DEPARTMENT_OTHER): Payer: Self-pay | Admitting: Emergency Medicine

## 2013-10-31 DIAGNOSIS — J039 Acute tonsillitis, unspecified: Secondary | ICD-10-CM | POA: Insufficient documentation

## 2013-10-31 DIAGNOSIS — IMO0002 Reserved for concepts with insufficient information to code with codable children: Secondary | ICD-10-CM | POA: Insufficient documentation

## 2013-10-31 DIAGNOSIS — F17200 Nicotine dependence, unspecified, uncomplicated: Secondary | ICD-10-CM | POA: Insufficient documentation

## 2013-10-31 DIAGNOSIS — Z79899 Other long term (current) drug therapy: Secondary | ICD-10-CM | POA: Insufficient documentation

## 2013-10-31 MED ORDER — PREDNISONE 20 MG PO TABS: 60.0000 mg | ORAL_TABLET | Freq: Every day | ORAL | Status: DC

## 2013-10-31 MED ORDER — AMOXICILLIN 500 MG PO CAPS: 500.0000 mg | ORAL_CAPSULE | Freq: Three times a day (TID) | ORAL | Status: DC

## 2013-10-31 NOTE — ED Notes (Signed)
Pt reports productive cough x 2 days

## 2013-10-31 NOTE — Discharge Instructions (Signed)
Tonsillitis Tonsillitis is an infection of the throat that causes the tonsils to become red, tender, and swollen. Tonsils are collections of lymphoid tissue at the back of the throat. Each tonsil has crevices (crypts). Tonsils help fight nose and throat infections and keep infection from spreading to other parts of the body for the first 18 months of life.  CAUSES Sudden (acute) tonsillitis is usually caused by infection with streptococcal bacteria. Long-lasting (chronic) tonsillitis occurs when the crypts of the tonsils become filled with pieces of food and bacteria, which makes it easy for the tonsils to become repeatedly infected. SYMPTOMS  Symptoms of tonsillitis include:  A sore throat, with possible difficulty swallowing.  White patches on the tonsils.  Fever.  Tiredness.  New episodes of snoring during sleep, when you did not snore before.  Small, foul-smelling, yellowish-white pieces of material (tonsilloliths) that you occasionally cough up or spit out. The tonsilloliths can also cause you to have bad breath. DIAGNOSIS Tonsillitis can be diagnosed through a physical exam. Diagnosis can be confirmed with the results of lab tests, including a throat culture. TREATMENT  The goals of tonsillitis treatment include the reduction of the severity and duration of symptoms and prevention of associated conditions. Symptoms of tonsillitis can be improved with the use of steroids to reduce the swelling. Tonsillitis caused by bacteria can be treated with antibiotic medicines. Usually, treatment with antibiotic medicines is started before the cause of the tonsillitis is known. However, if it is determined that the cause is not bacterial, antibiotic medicines will not treat the tonsillitis. If attacks of tonsillitis are severe and frequent, your health care provider may recommend surgery to remove the tonsils (tonsillectomy). HOME CARE INSTRUCTIONS   Rest as much as possible and get plenty of  sleep.  Drink plenty of fluids. While the throat is very sore, eat soft foods or liquids, such as sherbet, soups, or instant breakfast drinks.  Eat frozen ice pops.  Gargle with a warm or cold liquid to help soothe the throat. Mix 1/4 teaspoon of salt and 1/4 teaspoon of baking soda in in 8 oz of water. SEEK MEDICAL CARE IF:   Large, tender lumps develop in your neck.  A rash develops.  A green, yellow-brown, or bloody substance is coughed up.  You are unable to swallow liquids or food for 24 hours.  You notice that only one of the tonsils is swollen. SEEK IMMEDIATE MEDICAL CARE IF:   You develop any new symptoms such as vomiting, severe headache, stiff neck, chest pain, or trouble breathing or swallowing.  You have severe throat pain along with drooling or voice changes.  You have severe pain, unrelieved with recommended medications.  You are unable to fully open the mouth.  You develop redness, swelling, or severe pain anywhere in the neck.  You have a fever. MAKE SURE YOU:   Understand these instructions.  Will watch your condition.  Will get help right away if you are not doing well or get worse. Document Released: 02/10/2005 Document Revised: 05/08/2013 Document Reviewed: 10/20/2012 ExitCare Patient Information 2015 [employer]. This information is not intended to replace advice given to you by your health care provider. Make sure you discuss any questions you have with your health care provider.  

## 2013-10-31 NOTE — ED Provider Notes (Signed)
CSN: [id number]     Arrival date & time 10/31/13  [id number] History   First MD Initiated Contact with Patient 10/31/13 [phone]     Chief Complaint  Patient presents with  . Cough     (Consider location/radiation/quality/duration/timing/severity/associated sxs/prior Treatment) HPI Comments: Patient presents to the ER for evaluation of sore throat, nasal congestion. He does report a slight nonproductive cough. Patient reports symptoms began 2 days ago, progressively worsened.  Patient is a 28 y.o. male presenting with cough.  Cough Associated symptoms: sore throat     History reviewed. No pertinent past medical history. History reviewed. No pertinent past surgical history. No family history on file. History  Substance Use Topics  . Smoking status: Current Every Day Smoker -- 0.50 packs/day for 5 years    Types: Cigarettes  . Smokeless tobacco: Never Used  . Alcohol Use: Yes     Comment: social    Review of Systems  HENT: Positive for sore throat.   Respiratory: Positive for cough.   All other systems reviewed and are negative.     Allergies  Review of patient's allergies indicates no known allergies.  Home Medications   Prior to Admission medications   Medication Sig Start Date End Date Taking? Authorizing Provider  amoxicillin (AMOXIL) 500 MG capsule Take 1 capsule (500 mg total) by mouth 3 (three) times daily. 10/31/13   Gilda Creasehristopher J. Pollina, MD  cetirizine (ZYRTEC) 10 MG tablet Take 10 mg by mouth daily.    Historical Provider, MD  cyclobenzaprine (FLEXERIL) 10 MG tablet Take 1 tablet (10 mg total) by mouth 3 (three) times daily as needed for muscle spasms. 07/30/12   Charles B. Bernette MayersSheldon, MD  fluticasone [facility](FLONASE) 50 MCG/ACT nasal spray Place 2 sprays into the nose daily. 01/27/12 01/26/13  April K Palumbo-Rasch, MD  HYDROcodone-acetaminophen (NORCO) 5-325 MG per tablet Take 2 tablets by mouth every 4 (four) hours as needed for pain. 08/18/12   Geoffery Lyonsouglas Delo, MD   HYDROcodone-acetaminophen (NORCO/VICODIN) 5-325 MG per tablet Take 1-2 tablets by mouth every 6 (six) hours as needed for moderate pain. 10/14/13   Carlisle BeersJohn L Molpus, MD  ibuprofen (ADVIL,MOTRIN) 800 MG tablet Take 1 tablet (800 mg total) by mouth every 8 (eight) hours as needed for pain. 07/30/12   Charles B. Bernette MayersSheldon, MD  indomethacin (INDOCIN) 25 MG capsule Take 1 capsule (25 mg total) by mouth 3 (three) times daily as needed. 08/18/12   Geoffery Lyonsouglas Delo, MD  oxyCODONE-acetaminophen (PERCOCET/ROXICET) 5-325 MG per tablet Take 1 tablet by mouth every 4 (four) hours as needed for severe pain. 07/31/13   Hilario Quarryanielle S Ray, MD  predniSONE (DELTASONE) 10 MG tablet Take 2 tablets (20 mg total) by mouth daily. 07/31/13   Hilario Quarryanielle S Ray, MD  predniSONE (DELTASONE) 20 MG tablet Take 3 tablets (60 mg total) by mouth daily with breakfast. 10/31/13   Gilda Creasehristopher J. Pollina, MD   BP 139/82  Pulse 70  Temp(Src) 98.3 F (36.8 C) (Oral)  Resp 18  Ht 5\' 10"  (1.778 m)  Wt 206 lb (93.441 kg)  BMI 29.56 kg/m2  SpO2 98% Physical Exam  Constitutional: He is oriented to person, place, and time. He appears well-developed and well-nourished. No distress.  HENT:  Head: Normocephalic and atraumatic.  Right Ear: Hearing normal.  Left Ear: Hearing normal.  Nose: Nose normal.  Mouth/Throat: Mucous membranes are normal. Oropharyngeal exudate and posterior oropharyngeal erythema present. No tonsillar abscesses.  Eyes: Conjunctivae and EOM are normal. Pupils are equal, round, and reactive to  light.  Neck: Normal range of motion. Neck supple.  Cardiovascular: Regular rhythm, S1 normal and S2 normal.  Exam reveals no gallop and no friction rub.   No murmur heard. Pulmonary/Chest: Effort normal and breath sounds normal. No respiratory distress. He exhibits no tenderness.  Abdominal: Soft. Normal appearance and bowel sounds are normal. There is no hepatosplenomegaly. There is no tenderness. There is no rebound, no guarding, no tenderness  at McBurney's point and negative Murphy's sign. No hernia.  Musculoskeletal: Normal range of motion.  Neurological: He is alert and oriented to person, place, and time. He has normal strength. No cranial nerve deficit or sensory deficit. Coordination normal. GCS eye subscore is 4. GCS verbal subscore is 5. GCS motor subscore is 6.  Skin: Skin is warm, dry and intact. No rash noted. No cyanosis.  Psychiatric: He has a normal mood and affect. His speech is normal and behavior is normal. Thought content normal.    ED Course  Procedures (including critical care time) Labs Review Labs Reviewed - No data to display  Imaging Review No results found.   EKG Interpretation None      MDM   Final diagnoses:  Tonsillitis   Thick white exudate in the posterior oropharynx and tonsils. Patient has history of recurrent strep pharyngitis. Patient to be treated empirically with amoxicillin.   Gilda Creasehristopher J. Pollina, MD 10/31/13 [phone]

## 2014-07-17 ENCOUNTER — Emergency Department (HOSPITAL_BASED_OUTPATIENT_CLINIC_OR_DEPARTMENT_OTHER)
Admission: EM | Admit: 2014-07-17 | Discharge: 2014-07-17 | Disposition: A | Discharge status: Home / Self Care | Payer: Self-pay | Attending: Emergency Medicine | Admitting: Emergency Medicine

## 2014-07-17 ENCOUNTER — Encounter (HOSPITAL_BASED_OUTPATIENT_CLINIC_OR_DEPARTMENT_OTHER): Payer: Self-pay | Admitting: *Deleted

## 2014-07-17 ENCOUNTER — Emergency Department (HOSPITAL_BASED_OUTPATIENT_CLINIC_OR_DEPARTMENT_OTHER): Payer: Self-pay

## 2014-07-17 DIAGNOSIS — Z720 Tobacco use: Secondary | ICD-10-CM | POA: Insufficient documentation

## 2014-07-17 DIAGNOSIS — M545 Low back pain, unspecified: Secondary | ICD-10-CM

## 2014-07-17 DIAGNOSIS — Z79899 Other long term (current) drug therapy: Secondary | ICD-10-CM | POA: Insufficient documentation

## 2014-07-17 DIAGNOSIS — Z792 Long term (current) use of antibiotics: Secondary | ICD-10-CM | POA: Insufficient documentation

## 2014-07-17 DIAGNOSIS — Z7952 Long term (current) use of systemic steroids: Secondary | ICD-10-CM | POA: Insufficient documentation

## 2014-07-17 DIAGNOSIS — Z7951 Long term (current) use of inhaled steroids: Secondary | ICD-10-CM | POA: Insufficient documentation

## 2014-07-17 DIAGNOSIS — G8929 Other chronic pain: Secondary | ICD-10-CM | POA: Insufficient documentation

## 2014-07-17 LAB — CBC WITH DIFFERENTIAL/PLATELET
Basophils Absolute: 0 10*3/uL (ref 0.0–0.1)
Basophils Relative: 0 % (ref 0–1)
EOS ABS: 0.2 10*3/uL (ref 0.0–0.7)
EOS PCT: 3 % (ref 0–5)
HCT: 45 % (ref 39.0–52.0)
Hemoglobin: 15.7 g/dL (ref 13.0–17.0)
Lymphocytes Relative: 32 % (ref 12–46)
Lymphs Abs: 2.2 10*3/uL (ref 0.7–4.0)
MCH: 30.4 pg (ref 26.0–34.0)
MCHC: 34.9 g/dL (ref 30.0–36.0)
MCV: 87.2 fL (ref 78.0–100.0)
Monocytes Absolute: 0.8 10*3/uL (ref 0.1–1.0)
Monocytes Relative: 11 % (ref 3–12)
NEUTROS PCT: 54 % (ref 43–77)
Neutro Abs: 3.8 10*3/uL (ref 1.7–7.7)
PLATELETS: 320 10*3/uL (ref 150–400)
RBC: 5.16 MIL/uL (ref 4.22–5.81)
RDW: 12.1 % (ref 11.5–15.5)
WBC: 7 10*3/uL (ref 4.0–10.5)

## 2014-07-17 LAB — BASIC METABOLIC PANEL
ANION GAP: 0 — AB (ref 5–15)
BUN: 11 mg/dL (ref 6–23)
CALCIUM: 9.2 mg/dL (ref 8.4–10.5)
CHLORIDE: 105 mmol/L (ref 96–112)
CO2: 30 mmol/L (ref 19–32)
CREATININE: 1.01 mg/dL (ref 0.50–1.35)
GFR calc Af Amer: >90 mL/min (ref >90)
GFR calc non Af Amer: >90 mL/min (ref >90)
GLUCOSE: 109 mg/dL — AB (ref 70–99)
Potassium: 4.1 mmol/L (ref 3.5–5.1)
Sodium: 135 mmol/L (ref 135–145)

## 2014-07-17 LAB — URINALYSIS, ROUTINE W REFLEX MICROSCOPIC
BILIRUBIN URINE: NEGATIVE
Glucose, UA: NEGATIVE mg/dL
Hgb urine dipstick: NEGATIVE
Ketones, ur: NEGATIVE mg/dL
LEUKOCYTES UA: NEGATIVE
NITRITE: NEGATIVE
PH: 6.5 (ref 5.0–8.0)
Protein, ur: NEGATIVE mg/dL
SPECIFIC GRAVITY, URINE: 1.028 (ref 1.005–1.030)
UROBILINOGEN UA: 1 mg/dL (ref 0.0–1.0)

## 2014-07-17 MED ORDER — IBUPROFEN 600 MG PO TABS: 600.0000 mg | ORAL_TABLET | Freq: Four times a day (QID) | ORAL | Status: DC | PRN

## 2014-07-17 MED ORDER — CYCLOBENZAPRINE HCL 10 MG PO TABS: 10.0000 mg | ORAL_TABLET | Freq: Two times a day (BID) | ORAL | Status: DC | PRN

## 2014-07-17 NOTE — ED Provider Notes (Signed)
CSN: [id number]     Arrival date & time 07/17/14  0903 History   None    Chief Complaint  Patient presents with  . Back Pain     (Consider location/radiation/quality/duration/timing/severity/associated sxs/prior Treatment) HPI The patient reports he's had back pain for a year and a half to 2 years. He describes it as being both sides of his back variably. He gets sharp shooting pains that are on his side and mostly involve his lower back. He does not endorse the right or the left more. This has been coming and going. He states at this time it is interfering with his ability to work out and go to work. He does not report abdominal pain, vomiting, urinary symptoms, fever, weakness numbness or tingling to his legs or other associated symptoms. He reports he knows her something wrong. History reviewed. No pertinent past medical history. No past surgical history on file. No family history on file. History  Substance Use Topics  . Smoking status: Current Every Day Smoker -- 0.50 packs/day for 5 years    Types: Cigarettes  . Smokeless tobacco: Never Used  . Alcohol Use: Yes     Comment: social    Review of Systems 10 Systems reviewed and are negative for acute change except as noted in the HPI.    Allergies  Review of patient's allergies indicates no known allergies.  Home Medications   Prior to Admission medications   Medication Sig Start Date End Date Taking? Authorizing Provider  amoxicillin (AMOXIL) 500 MG capsule Take 1 capsule (500 mg total) by mouth 3 (three) times daily. 10/31/13   Gilda Crease, MD  cetirizine (ZYRTEC) 10 MG tablet Take 10 mg by mouth daily.    Historical Provider, MD  cyclobenzaprine (FLEXERIL) 10 MG tablet Take 1 tablet (10 mg total) by mouth 3 (three) times daily as needed for muscle spasms. 07/30/12   Charles B. Bernette Mayers, MD  cyclobenzaprine (FLEXERIL) 10 MG tablet Take 1 tablet (10 mg total) by mouth 2 (two) times daily as needed for muscle spasms.  07/17/14   Arby Barrette, MD  fluticasone (FLONASE) 50 MCG/ACT nasal spray Place 2 sprays into the nose daily. 01/27/12 01/26/13  April K Palumbo-Rasch, MD  HYDROcodone-acetaminophen (NORCO) 5-325 MG per tablet Take 2 tablets by mouth every 4 (four) hours as needed for pain. 08/18/12   Geoffery Lyons, MD  HYDROcodone-acetaminophen (NORCO/VICODIN) 5-325 MG per tablet Take 1-2 tablets by mouth every 6 (six) hours as needed for moderate pain. 10/14/13   Carlisle Beers Molpus, MD  ibuprofen (ADVIL,MOTRIN) 600 MG tablet Take 1 tablet (600 mg total) by mouth every 6 (six) hours as needed. 07/17/14   Arby Barrette, MD  ibuprofen (ADVIL,MOTRIN) 800 MG tablet Take 1 tablet (800 mg total) by mouth every 8 (eight) hours as needed for pain. 07/30/12   Charles B. Bernette Mayers, MD  indomethacin (INDOCIN) 25 MG capsule Take 1 capsule (25 mg total) by mouth 3 (three) times daily as needed. 08/18/12   Geoffery Lyons, MD  oxyCODONE-acetaminophen (PERCOCET/ROXICET) 5-325 MG per tablet Take 1 tablet by mouth every 4 (four) hours as needed for severe pain. 07/31/13   Hilario Quarry, MD  predniSONE (DELTASONE) 10 MG tablet Take 2 tablets (20 mg total) by mouth daily. 07/31/13   Hilario Quarry, MD  predniSONE (DELTASONE) 20 MG tablet Take 3 tablets (60 mg total) by mouth daily with breakfast. 10/31/13   Gilda Crease, MD   BP 122/79 mmHg  Pulse 58  Temp(Src)  97.6 F (36.4 C) (Oral)  Resp 16  Ht 5\' 10"  (1.778 m)  Wt 213 lb 14.4 oz (97.024 kg)  BMI 30.69 kg/m2  SpO2 100% Physical Exam  Constitutional: He is oriented to person, place, and time. He appears well-developed and well-nourished.  HENT:  Head: Normocephalic and atraumatic.  Eyes: EOM are normal. Pupils are equal, round, and reactive to light.  Neck: Neck supple.  Cardiovascular: Normal rate, regular rhythm, normal heart sounds and intact distal pulses.   Pulmonary/Chest: Effort normal and breath sounds normal.  Abdominal: Soft. Bowel sounds are normal. He exhibits no  distension. There is no tenderness.  Musculoskeletal: Normal range of motion. He exhibits no edema.  The patient has normal range of motion at the back. He has been supine to sitting. Sitting to standing. He has rotated and flexed without signs of limitation or pain. Visual examination of the back is normal. There is no CVA tenderness. There is no bony point tenderness.  Neurological: He is alert and oriented to person, place, and time. He has normal strength. No cranial nerve deficit. He exhibits normal muscle tone. Coordination normal. GCS eye subscore is 4. GCS verbal subscore is 5. GCS motor subscore is 6.  Skin: Skin is warm, dry and intact.  Psychiatric: He has a normal mood and affect.    ED Course  Procedures (including critical care time) Labs Review Labs Reviewed  BASIC METABOLIC PANEL - Abnormal; Notable for the following:    Glucose, Bld 109 (*)    Anion gap 0 (*)    All other components within normal limits  URINALYSIS, ROUTINE W REFLEX MICROSCOPIC - Abnormal; Notable for the following:    APPearance CLOUDY (*)    All other components within normal limits  CBC WITH DIFFERENTIAL/PLATELET    Imaging Review Dg Lumbar Spine Complete  07/17/2014   CLINICAL DATA:  Low back pain while exercising  EXAM: LUMBAR SPINE - COMPLETE 4+ VIEW  COMPARISON:  None.  FINDINGS: There is no evidence of lumbar spine fracture. Alignment is normal. Intervertebral disc spaces are maintained.  IMPRESSION: No acute abnormality noted.   Electronically Signed   By: Alcide CleverMark  Lukens M.D.   On: 07/17/2014 10:07     EKG Interpretation None      MDM   Final diagnoses:  Bilateral low back pain without sciatica   The patient describes chronic recurrent back pain. There is no associated neurologic dysfunction. His physical examination is normal. The patient describes the pain as being bilateral and predominantly lower but in his flanks as well as. At this time a screening diagnostic workup is negative. The  patient's vital signs are normal, basic chemistries and renal function are normal, urinalysis is negative, and lumbar spine films do not show any acute abnormality. This appears most likely chronic musculoskeletal pain. The patient is counseled on the necessity to find a family physician for referrals for physical therapy and further diagnostic workup if indicated. Physical examination he is well-developed with symmetric musculature and no signs of gross atrophy or skeletal anomaly.    Arby BarretteMarcy Yanett Conkright, MD 07/17/14 1230

## 2014-07-17 NOTE — Discharge Instructions (Signed)
Chronic Back Pain ° When back pain lasts longer than 3 months, it is called chronic back pain. People with chronic back pain often go through certain periods that are more intense (flare-ups).  °CAUSES °Chronic back pain can be caused by wear and tear (degeneration) on different structures in your back. These structures include: °· The bones of your spine (vertebrae) and the joints surrounding your spinal cord and nerve roots (facets). °· The strong, fibrous tissues that connect your vertebrae (ligaments). °Degeneration of these structures may result in pressure on your nerves. This can lead to constant pain. °HOME CARE INSTRUCTIONS °· Avoid bending, heavy lifting, prolonged sitting, and activities which make the problem worse. °· Take brief periods of rest throughout the day to reduce your pain. Lying down or standing usually is better than sitting while you are resting. °· Take over-the-counter or prescription medicines only as directed by your caregiver. °SEEK IMMEDIATE MEDICAL CARE IF:  °· You have weakness or numbness in one of your legs or feet. °· You have trouble controlling your bladder or bowels. °· You have nausea, vomiting, abdominal pain, shortness of breath, or fainting. °Document Released: 06/10/2004 Document Revised: 07/26/2011 Document Reviewed: 04/17/2011 °ExitCare® Patient Information ©2015 [employer]. This information is not intended to replace advice given to you by your health care provider. Make sure you discuss any questions you have with your health care provider. ° ° °Emergency Department Resource Guide °1) Find a Doctor and Pay Out of Pocket °Although you won't have to find out who is covered by your insurance plan, it is a good idea to ask around and get recommendations. You will then need to call the office and see if the doctor you have chosen will accept you as a new patient and what types of options they offer for patients who are self-pay. Some doctors offer discounts or will set  up payment plans for their patients who do not have insurance, but you will need to ask so you aren't surprised when you get to your appointment. ° °2) Contact Your Local Health Department °Not all health departments have doctors that can see patients for sick visits, but many do, so it is worth a call to see if yours does. If you don't know where your local health department is, you can check in your phone book. The CDC also has a tool to help you locate your state's health department, and many state websites also have listings of all of their local health departments. ° °3) Find a Walk-in Clinic °If your illness is not likely to be very severe or complicated, you may want to try a walk in clinic. These are popping up all over the country in pharmacies, drugstores, and shopping centers. They're usually staffed by nurse practitioners or physician assistants that have been trained to treat common illnesses and complaints. They're usually fairly quick and inexpensive. However, if you have serious medical issues or chronic medical problems, these are probably not your best option. ° °No Primary Care Doctor: °- Call [facility] at  832-8000 - they can help you locate a primary care doctor that  accepts your insurance, provides certain services, etc. °- Physician Referral Service- 1-800-533-3463 ° °Chronic Pain Problems: °Organization         Address  Phone   Notes  °[facility]  (336) 297-2271 Patients need to be referred by their primary care doctor.  ° °Medication Assistance: °Organization         Address    Phone   Notes  °[place] [facility] [address] °[place], [place] [address] (336) 641-8030 --Must be a resident of [place] °-- Must have NO insurance coverage whatsoever (no Medicaid/ Medicare, etc.) °-- The pt. MUST have a primary care doctor that directs their care regularly and follows them in the community °  °[facility]  (866) 331-1348    °[employer]  (888) 892-1162   ° °Agencies that provide inexpensive medical care: °Organization         Address  Phone   Notes  °[facility]  (336) 832-8035   °[facility]    (336) 832-7272   °[facility] [address] °[place], [place] [address] (336) 832-4777   °[facility] [address], °[place] (336) 271-4999   °[facility]    (336) 373-0678   °[facility]    (336) 272-1050   °[facility] ° [address], [place] Phone:  (336) 832-4444, Fax:  (336) 832-4440 Hours of Operation:  9 am - 6 pm, M-F.  Also accepts Medicaid/Medicare and self-pay.  °[facility] ° [address], [place] Phone: (336) 832-3150, Fax: (336) [phone]. Hours of Operation:  8:30 am - 5:30 pm, M-F.  Also accepts Medicaid and self-pay.  °[facility] [place] [address], [place] Phone: (336) 878-6027   °[facility] [address], [place], [place] (336)723-1848, Ext. 123 Mondays & Thursdays: 7-9 AM.  First 15 patients are seen on a first come, first serve basis. °  ° °Medicaid-accepting [place] Providers: ° °Organization         Address  Phone   Notes  °[facility] [address], [place] (336) 641-2100 Also accepts self-pay patients.  °[facility] [address], [place] ° (336) 856-9996   °[facility] [address], [place] (336) 288-8857   °[facility] [address] [place] [address], [place] (336) 299-7000   °Veita Bland [address], [place]  ° (336) 373-1557 Only accepts [place] Access Medicaid patients after they have their name applied to their card.  ° °Self-Pay (no insurance) in [place]: ° °Organization         Address  Phone   Notes  °Sickle Cell Patients, [facility] [address],  [place] (336) 832-1970   °[facility] [address], [place] (336) 832-4400   °[facility] ° [address], [place] (336) 992-4800   °[facility]/Dr. Osei-Bonsu ° [address] [place] [address],  [place] or [address], [place] (336) 841-8500 Phone number for both [place] and [place] locations is the same.  °[facility] [address], [place] (336) 299-0000   °[facility] [address] [place] [address], [place] or [address] (336) 852-7530 °(336) 878-2260   °[facility] [address],  (336) 350-1642, phone; (336) 294-5005, fax Sees patients 1st and 3rd Saturday of every month.  Must not qualify for public or private insurance (i.e. Medicaid, Medicare,  Health Choice, Veterans' Benefits) • Household income should be no more than 200% of the poverty level •The clinic cannot treat you if you are pregnant or think you are pregnant •   Sexually transmitted diseases are not treated at the clinic.  ° ° °Dental Care: °Organization         Address  Phone  Notes  °[facility] [address], [place] (336) 641-6152 Accepts children up to age 21 who are enrolled in Medicaid or [place] Health Choice; pregnant women with a Medicaid card; and children who have applied for Medicaid or [place] Health Choice, but were declined, whose parents can pay a reduced fee at time of service.  °[facility] [place]  [address], [place] (336) 641-7733 Accepts children up to age 21 who are enrolled in Medicaid or [place] Health Choice; pregnant women with a Medicaid card; and children who have applied for Medicaid or [place] Health Choice, but were declined, whose parents can pay a reduced fee at time of service.  °[facility] ° [address], [place] (336)  641-4533 Patients are seen by appointment only. Walk-ins are not accepted. [facility] will see patients 18 years of age and older. °Monday - Tuesday (8am-5pm) °Most Wednesdays (8:30-5pm) °$30 per visit, cash only  °[facility] ° [address], [place] (336) 641-4533 Patients are seen by appointment only. Walk-ins are not accepted. [facility] will see patients 18 years of age and older. °One Wednesday Evening (Monthly: Volunteer Based).  $30 per visit, cash only  °[facility]  (919) 537-3737 for adults; Children under age 4, call Graduate Pediatric Dentistry at ([phone]) [phone]. Children aged 4-14, please call ([phone]) [phone] to request a pediatric application. ° Dental services are provided in all areas of dental care including fillings, crowns and bridges, complete and partial dentures, implants, gum treatment, root canals, and extractions. Preventive care is also provided. Treatment is provided to both adults and children. °Patients are selected via a lottery and there is often a waiting list. °  °[facility] [address], °[place] ° (336) 763-8833 [email or web address] °  °[facility] [address], [place], [place] (336)723-1848, Ext. 123 Second and Fourth Thursday of each month, opens at 6:30 AM; Clinic ends at 9 AM.  Patients are seen on a first-come first-served basis, and a limited number are seen during each clinic.  ° °[facility] ° [address], [place], [place] (336) 723-7904   Eligibility Requirements °You must have lived in [place], [place], or [place] counties for at least the last three months. °  You cannot be eligible for state or federal sponsored healthcare insurance, including Veterans Administration, Medicaid, or Medicare. °  You generally cannot be eligible for healthcare insurance through your employer.  °  How to apply: °Eligibility screenings are held every Tuesday and Wednesday afternoon  from 1:00 pm until 4:00 pm. You do not need an appointment for the interview!  °[facility] [address], [place], [place] 336-631-2330   °[facility]  336-342-8273   °[facility]  336-703-3100   °[facility]  336-570-6415   ° °Behavioral Health Resources in the Community: °Intensive Outpatient Programs °Organization         Address  Phone  Notes  °[facility] [address], [place], [place] 336-878-6098   ° [facility] [address], [place], [place] 336-832-9800   °[facility] [address], [place], [place] °   336-882-2125   °[facility] [address],  °[place], [place] 1-800-853-5163 or 336-641-4981   °Substance Abuse Resources °Organization         Address  Phone  Notes  °[facility]  336-882-2125   °[facility]  336-784-9470   °[facility]  336-285-9073   °[facility]  336-845-3988   °Residential & Outpatient Substance Abuse Program  1-800-659-3381   °Psychological Services °Organization         Address  Phone  Notes  °[facility]  336- 832-9600   °[facility]  336- 378-7881   °[facility] [address], [place] 1-800-853-5163 or 336-641-4981   ° °Mobile Crisis Teams °Organization         Address  Phone  Notes  °[facility], Mobile Crisis Care Unit  1-877-626-1772   °Assertive °[facility] ° [address] [place], [place] 336-834-9664   °Sharon DeEsch [address] °[place] 336-554-5454   ° °Self-Help/Support Groups °Organization         Address  Phone             Notes  °[facility] - variety of support groups  336- 373-1402 Call for more information  °Narcotics Anonymous (NA), [facility] [address], °[place]  2 meetings at this location  ° °Residential Treatment  Programs °Organization         Address  Phone  Notes  °[facility] [address],    °[place]  1-866-801-8205   °[facility] ° [address], [place], [place] 704-293-8524   °[facility] [address], [place] 336-845-3988 Admissions: 8am-3pm M-F  °[facility] [address]    °[place], [place] 336-841-1104   °[facility] [address], [place], [place] 336-379-7146   °[facility] [address]  °[place], [place] 336-285-9073   °[facility] - Intensive Outpatient [address], [place], [place] 336-852-3033   °[facility] [address]  °[place], [place] 1-877-615-2722 or 336-784-9470   °[facility] [address] [place], [place] 336-227-7417 Accepts Medicaid  °[facility] [address]  °[place] 1-800-659-3381 Substance Abuse/Addiction Treatment  ° °[place] Behavioral Health Resources °Organization         Address  Phone  Notes  °[facility]  (888) 581-9988   °Julie Brannon, PhD [address] [place], [place]   (336) 349-5553 or (336) 951-0000   ° Behavioral   [address] °[place], [place] (336) 349-4454   °[facility] [address], [place], [place] (336) 342-8316 Insurance/Medicaid/sponsorship through [facility]  °[facility] [address]                                    [place], [place] (336) 342-8316 Therapy/tele-psych/case  °[facility] [address]  ° [place], [place] (336) 349-2233    °Dr. Arfeen  (336) 349-4544   °[facility] [place]  [employer] [place] [facility] 1) [address], [place] °2) [address], [place] °3)  [address], [place] (336) 349-3220 °(336) 342-7768 ° °(336) 342-8140   °[place] [facility] (336) 342-1394 or (336) 342-3537 (  After Hours)    ° ° ° °

## 2014-07-17 NOTE — ED Notes (Signed)
MD at bedside. 

## 2014-07-17 NOTE — ED Notes (Signed)
D/c home- directed to pharmacy to pick up meds 

## 2014-07-17 NOTE — ED Notes (Signed)
Patient transported to X-ray 

## 2014-07-17 NOTE — ED Notes (Signed)
Pt reports back pain for years- worse x 1-2 months- worse when exercising- pain radiates from lower back up to mid back

## 2014-10-22 ENCOUNTER — Encounter (HOSPITAL_BASED_OUTPATIENT_CLINIC_OR_DEPARTMENT_OTHER): Payer: Self-pay | Admitting: *Deleted

## 2014-10-22 ENCOUNTER — Emergency Department (HOSPITAL_BASED_OUTPATIENT_CLINIC_OR_DEPARTMENT_OTHER)
Admission: EM | Admit: 2014-10-22 | Discharge: 2014-10-23 | Disposition: A | Discharge status: Home / Self Care | Payer: Self-pay | Attending: Emergency Medicine | Admitting: Emergency Medicine

## 2014-10-22 DIAGNOSIS — A084 Viral intestinal infection, unspecified: Secondary | ICD-10-CM | POA: Insufficient documentation

## 2014-10-22 DIAGNOSIS — Z720 Tobacco use: Secondary | ICD-10-CM | POA: Insufficient documentation

## 2014-10-22 NOTE — ED Notes (Signed)
Abdominal pain. Aching all over. Headache and dizziness while playing basketball today. Vomiting. Cold for a week.

## 2014-10-22 NOTE — ED Notes (Addendum)
Patient reports vomiting "10x since this morning".  Reports bilateral flank pain.  Denies hematuria, dysuria. Reports generalized abdominal pain.  Reports dizziness.

## 2014-10-23 NOTE — ED Provider Notes (Addendum)
See Epic downtime paperwork.  2:10 AM Patient drinking fluids without emesis. Feels better. He states he has 2 daughters who have had similar illnesses recently.  Paula LibraJohn Orey Moure, MD 10/23/14 0210  Paula LibraJohn Latrel Szymczak, MD 11/06/14 2237

## 2014-11-19 ENCOUNTER — Encounter (HOSPITAL_BASED_OUTPATIENT_CLINIC_OR_DEPARTMENT_OTHER): Payer: Self-pay | Admitting: *Deleted

## 2014-11-19 ENCOUNTER — Emergency Department (HOSPITAL_BASED_OUTPATIENT_CLINIC_OR_DEPARTMENT_OTHER)
Admission: EM | Admit: 2014-11-19 | Discharge: 2014-11-19 | Disposition: A | Discharge status: Home / Self Care | Payer: Self-pay | Attending: Emergency Medicine | Admitting: Emergency Medicine

## 2014-11-19 DIAGNOSIS — Z720 Tobacco use: Secondary | ICD-10-CM | POA: Insufficient documentation

## 2014-11-19 DIAGNOSIS — Y929 Unspecified place or not applicable: Secondary | ICD-10-CM | POA: Insufficient documentation

## 2014-11-19 DIAGNOSIS — Y999 Unspecified external cause status: Secondary | ICD-10-CM | POA: Insufficient documentation

## 2014-11-19 DIAGNOSIS — S0501XA Injury of conjunctiva and corneal abrasion without foreign body, right eye, initial encounter: Secondary | ICD-10-CM

## 2014-11-19 DIAGNOSIS — Y939 Activity, unspecified: Secondary | ICD-10-CM | POA: Insufficient documentation

## 2014-11-19 DIAGNOSIS — X58XXXA Exposure to other specified factors, initial encounter: Secondary | ICD-10-CM | POA: Insufficient documentation

## 2014-11-19 MED ORDER — FLUORESCEIN SODIUM 1 MG OP STRP
1.0000 | ORAL_STRIP | Freq: Once | OPHTHALMIC | Status: AC
  Administered 2014-11-19: 1 via OPHTHALMIC
  Filled 2014-11-19: qty 1

## 2014-11-19 MED ORDER — ERYTHROMYCIN 5 MG/GM OP OINT: TOPICAL_OINTMENT | OPHTHALMIC | Status: DC

## 2014-11-19 MED ORDER — TETRACAINE HCL 0.5 % OP SOLN
2.0000 [drp] | Freq: Once | OPHTHALMIC | Status: AC
  Administered 2014-11-19: 2 [drp] via OPHTHALMIC
  Filled 2014-11-19: qty 2

## 2014-11-19 MED ORDER — IBUPROFEN 800 MG PO TABS
800.0000 mg | ORAL_TABLET | Freq: Once | ORAL | Status: AC
  Administered 2014-11-19: 800 mg via ORAL
  Filled 2014-11-19: qty 1

## 2014-11-19 MED ORDER — CETIRIZINE-PSEUDOEPHEDRINE ER 5-120 MG PO TB12
1.0000 | ORAL_TABLET | Freq: Two times a day (BID) | ORAL | Status: DC
Start: 2014-11-19 — End: 2015-07-02

## 2014-11-19 NOTE — ED Notes (Signed)
C/o pain and blurry vision rt eye x 2 days  Denies inj   Styes in left eye x 2 months

## 2014-11-19 NOTE — ED Notes (Signed)
C/o rt eye pain and blurry vision x 3 days  Denies inj

## 2014-11-19 NOTE — ED Notes (Signed)
Unable to contact anyone at [phone]   Left call back number

## 2014-11-19 NOTE — ED Provider Notes (Signed)
CSN: [id number]     Arrival date & time 11/19/14  4098 History   First MD Initiated Contact with Patient 11/19/14 [phone]     Chief Complaint  Patient presents with  . Eye Pain     (Consider location/radiation/quality/duration/timing/severity/associated sxs/prior Treatment) Patient is a 29 y.o. male presenting with eye pain. The history is provided by the patient.  Eye Pain This is a new problem. The current episode started more than 2 days ago. The problem occurs constantly. The problem has not changed since onset.Pertinent negatives include no chest pain, no abdominal pain, no headaches and no shortness of breath. Nothing aggravates the symptoms. Nothing relieves the symptoms. He has tried nothing for the symptoms. The treatment provided no relief.  Has been outside a lot is not sure if he got anything in it  History reviewed. No pertinent past medical history. History reviewed. No pertinent past surgical history. No family history on file. History  Substance Use Topics  . Smoking status: Current Every Day Smoker -- 0.50 packs/day for 5 years    Types: Cigarettes  . Smokeless tobacco: Never Used  . Alcohol Use: Yes     Comment: social    Review of Systems  Constitutional: Negative.   Eyes: Positive for pain and itching. Negative for photophobia, discharge and visual disturbance.  Respiratory: Negative for shortness of breath.   Cardiovascular: Negative for chest pain.  Gastrointestinal: Negative for abdominal pain.  Neurological: Negative for headaches.  All other systems reviewed and are negative.     Allergies  Review of patient's allergies indicates no known allergies.  Home Medications   Prior to Admission medications   Not on File   BP 121/74 mmHg  Pulse 64  Temp(Src) 98.3 F (36.8 C) (Oral)  Resp 20  Ht  (1.778 m)  Wt 205 lb (92.987 kg)  BMI 29.41 kg/m2  SpO2 99% Physical Exam  Constitutional: He is oriented to person, place, and time. He appears  well-developed and well-nourished.  HENT:  Head: Normocephalic and atraumatic.  Mouth/Throat: Oropharynx is clear and moist.  Eyes: EOM are normal. Pupils are equal, round, and reactive to light. Right eye exhibits normal extraocular motion and no nystagmus.  Fundoscopic exam:      The right eye shows no AV nicking and no hemorrhage.       The left eye shows no AV nicking and no hemorrhage.  Slit lamp exam:      The right eye shows corneal abrasion. The right eye shows no hyphema.       The left eye shows no hyphema.    Neck: Normal range of motion. Neck supple.  Cardiovascular: Normal rate, regular rhythm and intact distal pulses.   Pulmonary/Chest: Effort normal and breath sounds normal. No respiratory distress. He has no wheezes. He has no rales.  Abdominal: Soft. Bowel sounds are normal. There is no tenderness. There is no rebound and no guarding.  Neurological: He is alert and oriented to person, place, and time.  Skin: Skin is warm and dry.  Psychiatric: He has a normal mood and affect.    ED Course  Procedures (including critical care time) Labs Review Labs Reviewed - No data to display  Imaging Review No results found.   EKG Interpretation None      MDM   Final diagnoses:  None     Visual Acuity  Right Eye Distance: 20/40 Left Eye Distance: 20/30 Bilateral Distance: 20/30  Right Eye Near:   Left Eye  Near:    Bilateral Near:     Corneal abrasion.  Patient does not wear contact lenses will start erythromycin ointment and zyrtec as he is outside a lot to keep hin from scratching and reinjuring the area   Janayia Burggraf, MD 11/19/14 [phone]

## 2015-07-02 ENCOUNTER — Encounter (HOSPITAL_BASED_OUTPATIENT_CLINIC_OR_DEPARTMENT_OTHER): Payer: Self-pay

## 2015-07-02 ENCOUNTER — Emergency Department (HOSPITAL_BASED_OUTPATIENT_CLINIC_OR_DEPARTMENT_OTHER)
Admission: EM | Admit: 2015-07-02 | Discharge: 2015-07-02 | Disposition: A | Discharge status: Home / Self Care | Payer: Self-pay | Attending: Emergency Medicine | Admitting: Emergency Medicine

## 2015-07-02 DIAGNOSIS — K05219 Aggressive periodontitis, localized, unspecified severity: Secondary | ICD-10-CM | POA: Insufficient documentation

## 2015-07-02 DIAGNOSIS — F17210 Nicotine dependence, cigarettes, uncomplicated: Secondary | ICD-10-CM | POA: Insufficient documentation

## 2015-07-02 MED ORDER — IBUPROFEN 600 MG PO TABS: 600.0000 mg | ORAL_TABLET | Freq: Four times a day (QID) | ORAL | Status: DC | PRN

## 2015-07-02 MED ORDER — HYDROCODONE-ACETAMINOPHEN 5-325 MG PO TABS: 1.0000 | ORAL_TABLET | Freq: Once | ORAL | Status: DC

## 2015-07-02 MED ORDER — NAPROXEN 250 MG PO TABS
500.0000 mg | ORAL_TABLET | Freq: Once | ORAL | Status: AC
  Administered 2015-07-02: 500 mg via ORAL
  Filled 2015-07-02: qty 2

## 2015-07-02 MED ORDER — HYDROCODONE-ACETAMINOPHEN 5-325 MG PO TABS: 1.0000 | ORAL_TABLET | Freq: Four times a day (QID) | ORAL | Status: DC | PRN

## 2015-07-02 MED ORDER — PENICILLIN V POTASSIUM 250 MG PO TABS: 250.0000 mg | ORAL_TABLET | Freq: Four times a day (QID) | ORAL | Status: AC

## 2015-07-02 MED ORDER — PENICILLIN V POTASSIUM 250 MG PO TABS
250.0000 mg | ORAL_TABLET | Freq: Once | ORAL | Status: AC
  Administered 2015-07-02: 250 mg via ORAL
  Filled 2015-07-02: qty 1

## 2015-07-02 MED FILL — IBUPROFEN 600 MG TABLET: 600 | 7 days supply | Qty: 30 | Fill #0

## 2015-07-02 MED FILL — HYDROCODON-APAP 5-325: 5-325 | 2 days supply | Qty: 8 | Fill #0

## 2015-07-02 MED FILL — PENICILLIN VK 250 MG TABLET: 250 | 10 days supply | Qty: 40 | Fill #0

## 2015-07-02 NOTE — ED Notes (Signed)
C/o multiple bilat lower tooth ache-states he has taken aleve, leftover PCN and hydrocodone-pt with steady gait

## 2015-07-02 NOTE — ED Provider Notes (Signed)
CSN: [id number]     Arrival date & time 07/02/15  1137 History   First MD Initiated Contact with Patient 07/02/15 1229     Chief Complaint  Patient presents with  . Dental Pain     (Consider location/radiation/quality/duration/timing/severity/associated sxs/prior Treatment) HPI Comments: Pt comes in with cc of toothache. Pt has hx of poor dentition and has couple of teeth that have fallen out. He started having toothache on the the left lower jaw that started 1 day ago. Pain is severe, couldn't sleep at night. No headaches, nausea, drooling, fevers, chills, confusion, neck pain or stiffness. PT took OTC pain meds without relief. Gets off and on dental pain - but not this severe usually.    Patient is a 30 y.o. male presenting with tooth pain. The history is provided by the patient.  Dental Pain Associated symptoms: no drooling, no fever and no headaches     History reviewed. No pertinent past medical history. History reviewed. No pertinent past surgical history. No family history on file. Social History  Substance Use Topics  . Smoking status: Current Every Day Smoker -- 0.50 packs/day for 5 years    Types: Cigarettes  . Smokeless tobacco: Never Used  . Alcohol Use: Yes     Comment: occ    Review of Systems  Constitutional: Positive for activity change. Negative for fever.  HENT: Positive for dental problem. Negative for drooling and trouble swallowing.   Gastrointestinal: Negative for nausea and vomiting.  Allergic/Immunologic: Negative for immunocompromised state.  Neurological: Negative for headaches.  Hematological: Does not bruise/bleed easily.  Psychiatric/Behavioral: Negative for confusion.      Allergies  Review of patient's allergies indicates no known allergies.  Home Medications   Prior to Admission medications   Medication Sig Start Date End Date Taking? Authorizing Provider  HYDROcodone-acetaminophen (NORCO/VICODIN) 5-325 MG tablet Take 1 tablet by  mouth every 6 (six) hours as needed. 07/02/15   Derwood Kaplan, MD  ibuprofen (ADVIL,MOTRIN) 600 MG tablet Take 1 tablet (600 mg total) by mouth every 6 (six) hours as needed. 07/02/15   Derwood Kaplan, MD  penicillin v potassium (VEETID) 250 MG tablet Take 1 tablet (250 mg total) by mouth 4 (four) times daily. 07/02/15 07/09/15  Jannelly Bergren, MD   BP 153/86 mmHg  Pulse 54  Temp(Src) 98.4 F (36.9 C) (Oral)  Resp 20  Ht  (1.778 m)  Wt 205 lb (92.987 kg)  BMI 29.41 kg/m2  SpO2 100% Physical Exam  Constitutional: He is oriented to person, place, and time. He appears well-developed.  HENT:  Head: Atraumatic.  Neck: Neck supple.  Neg trismus. Pt has tenderness over the site of tooth #31. He is missing most of the tooth -  But remnants of the old primary tooth remain. Tenderness with palpation. Pt also has mild gingival swelling around that site -but there is no abscess or fluctuance.   Cardiovascular: Normal rate.   Pulmonary/Chest: Effort normal.  Lymphadenopathy:    He has no cervical adenopathy.  Neurological: He is alert and oriented to person, place, and time.  Skin: Skin is warm.  Nursing note and vitals reviewed.   ED Course  Procedures (including critical care time) Labs Review Labs Reviewed - No data to display  Imaging Review No results found. I have personally reviewed and evaluated these images and lab results as part of my medical decision-making.   EKG Interpretation None      MDM   Final diagnoses:  Periodontal abscess  DDx includes: - Periapical tooth infection - Dental abscess - Gingivitis - Dental trauma - Pulpitis - Nerve root compression  Pt appears to have a tooth infection. No ANUG, Lud[place]igs. No signs of deep infection right now. Strongly advised to see a Dentist soon, and info provided. Return precautions discussed.    Derwood Kaplan, MD 07/02/15 1345

## 2015-07-02 NOTE — Discharge Instructions (Signed)
See a Dentist as soon as possible. Please return to the ER if your symptoms worsen; you have increased pain, fevers, chills, headaches, neck pain, inability to keep any medications down, confusion. Otherwise see the outpatient doctor as requested.   Dental Abscess A dental abscess is pus in or around a tooth. HOME CARE  Take medicines only as told by your dentist.  If you were prescribed antibiotic medicine, finish all of it even if you start to feel better.  Rinse your mouth (gargle) often with salt water.  Do not drive or use heavy machinery, like a lawn mower, while taking pain medicine.  Do not apply heat to the outside of your mouth.  Keep all follow-up visits as told by your dentist. This is important. GET HELP IF:  Your pain is worse, and medicine does not help. GET HELP RIGHT AWAY IF:  You have a fever or chills.  Your symptoms suddenly get worse.  You have a very bad headache.  You have problems breathing or swallowing.  You have trouble opening your mouth.  You have puffiness (swelling) in your neck or around your eye.   This information is not intended to replace advice given to you by your health care provider. Make sure you discuss any questions you have with your health care provider.   Document Released: 09/17/2014 Document Reviewed: 09/17/2014 Elsevier Interactive Patient Education 2016 Elsevier Inc.   RESOURCE GUIDE  Chronic Pain Problems: Contact [facility]  [phone] Patients need to be referred by their primary care doctor.  Insufficient Money for Medicine: Contact [employer]:  call "211."   No Primary Care Doctor: - Call Health Connect  [phone] - can help you locate a primary care doctor that  accepts your insurance, provides certain services, etc. - Physician Referral Service- [phone]  Agencies that provide inexpensive medical care: - [facility]  [phone] - [facility]   [phone] - [facility]  [phone] - Women's Clinic  [phone] - [facility]  [phone] [facility]  [phone]  Medicaid-accepting [facility] Providers: - [facility]- [address]  [phone], Mon-Fri 9am-7pm, Sat 9am-1pm - [facility]- [address] [place], [address] [place]  [phone] - [facility]- [address], [address] [place]  [phone] [facility]- [address]  [phone] - Renaye Rakers- [address] [place], [address], [phone]  Only accepts [place] patients after they have their name  applied to their card  Self Pay (no insurance) in [place]: - Sickle Cell Patients: Dr Willey Blade, [facility]  [address] [place], [phone] - [facility]- [address] [place]  [phone]       [facility] [place]- [address]       -     [facility]- see information above (Speak to [employer] if you do not have insurance)       -  [facility]- [address],  [phone]       -  [facility]- [address] [phone]       -  Dr Julio Sicks-  [address], [place], [phone]       -  [facility] - [address] [phone]       -  [facility]- [address], [phone], also [address], [phone]       -    [facility]- [address] [place], [phone], 1st & 3rd Saturday        every month, 10am-1pm  [facility] [address] [place], [place] [address] [phone]  [facility] [address] [place], [place] [address] [phone]  1) Find a Doctor and Pay Out of Pocket Although you won't have to find out who is covered by your insurance plan, it is a good idea to ask around and get recommendations. You will then need to call the office and  see if the doctor you have chosen will accept you as a new patient and what types of options they offer for patients who are self-pay. Some doctors offer discounts or will set up payment plans for their patients who do not have insurance, but you will need to ask so you aren't surprised when you get to your appointment.  2) Contact Your Local Health Department Not all health departments have doctors that can see patients for sick visits, but many do, so it is worth a call to see if yours does. If you don't know where your local health department is, you can check in your phone book. The CDC also has a tool to help you locate your state's health department, and many state websites also have listings of all of their local health departments.  3) Find a Walk-in Clinic If your illness is not likely to be very severe or complicated, you may want to try a walk in clinic. These are popping up all over the country in pharmacies, drugstores, and shopping centers. They're usually staffed by nurse practitioners or physician assistants that have been trained to treat common illnesses and complaints. They're usually fairly quick and inexpensive. However, if you have serious medical issues or chronic medical problems, these are probably not your best option  STD Testing - [facility] Department of [facility], STD Clinic, [address] [place], phone [phone] or [phone].  Monday - Friday, call for an appointment. [facility] Department of [employer], STD Clinic, [address], [place], phone [phone] or [phone].  Monday - Friday, call for an appointment.  Abuse/Neglect: [facility] Child Abuse Hotline 360-313-4891 [facility] Child Abuse Hotline [phone] (After Hours)  Emergency Shelter:  [facility] [phone]  Maternity Homes: - [facility] [phone] - [facility] [phone]  MRSA Hotline #:   (684) 746-4355  Dental Assistance If unable to pay or uninsured, contact:  [facility]. to become qualified for the adult dental clinic.  Patients with Medicaid: [facility] [phone] [address], [phone] [address] [phone]  If unable to pay, or uninsured, contact [facility] [phone] in [place], [phone] in [facility]) to become qualified for the adult dental clinic  [facility] [address] [place], [place] [address] [phone] [email or web address]  Other Proofreader Services: - [facility]- [address] [place], [place], [place], [address], [phone], Ext. [phone], 2nd and 4th Thursday of the month at 6:30am.  10 clients each day by appointment, can sometimes see walk-in patients if someone does not show for an appointment. [facility]- [address] [place], [place], [address],  [phone] [facility]- [address], [place], [place], [address], [phone] - [facility]- [phone] [facility]- [phone] [facility]- [phone]

## 2015-07-03 ENCOUNTER — Emergency Department (HOSPITAL_BASED_OUTPATIENT_CLINIC_OR_DEPARTMENT_OTHER)
Admission: EM | Admit: 2015-07-03 | Discharge: 2015-07-03 | Disposition: A | Discharge status: Home / Self Care | Payer: Self-pay | Attending: Emergency Medicine | Admitting: Emergency Medicine

## 2015-07-03 ENCOUNTER — Encounter (HOSPITAL_BASED_OUTPATIENT_CLINIC_OR_DEPARTMENT_OTHER): Payer: Self-pay | Admitting: Emergency Medicine

## 2015-07-03 DIAGNOSIS — F17210 Nicotine dependence, cigarettes, uncomplicated: Secondary | ICD-10-CM | POA: Insufficient documentation

## 2015-07-03 DIAGNOSIS — K029 Dental caries, unspecified: Secondary | ICD-10-CM | POA: Insufficient documentation

## 2015-07-03 MED ORDER — BUPIVACAINE-EPINEPHRINE (PF) 0.5% -1:200000 IJ SOLN
1.8000 mL | Freq: Once | INTRAMUSCULAR | Status: AC
  Administered 2015-07-03: 1.8 mL
  Filled 2015-07-03: qty 1.8

## 2015-07-03 MED ORDER — OXYCODONE-ACETAMINOPHEN 5-325 MG PO TABS: 1.0000 | ORAL_TABLET | Freq: Four times a day (QID) | ORAL | Status: DC | PRN

## 2015-07-03 NOTE — ED Notes (Signed)
Pt c/o rt side lower tooth pain  Has been here for same,  Pain meds not helping,  Has not called dentist for follow up

## 2015-07-03 NOTE — ED Provider Notes (Signed)
CSN: [id number]     Arrival date & time 07/03/15  0007 History   First MD Initiated Contact with Patient 07/03/15 0216     Chief Complaint  Patient presents with  . Dental Pain     (Consider location/radiation/quality/duration/timing/severity/associated sxs/prior Treatment) HPI  This is a 30 year old male with a two-day history of pain in his right lower second molar which is decayed. He was seen yesterday and prescribed ibuprofen, hydrocodone and penicillin. He has been taking these without relief. He states he continues to have severe pain in that tooth radiating to the right side of his face. He was given a referral to a dentist but never got around to calling the dentist yesterday.  History reviewed. No pertinent past medical history. History reviewed. No pertinent past surgical history. History reviewed. No pertinent family history. Social History  Substance Use Topics  . Smoking status: Current Every Day Smoker -- 0.50 packs/day for 5 years    Types: Cigarettes  . Smokeless tobacco: Never Used  . Alcohol Use: Yes     Comment: occ    Review of Systems  All other systems reviewed and are negative.   Allergies  Review of patient's allergies indicates no known allergies.  Home Medications   Prior to Admission medications   Medication Sig Start Date End Date Taking? Authorizing Provider  HYDROcodone-acetaminophen (NORCO/VICODIN) 5-325 MG tablet Take 1 tablet by mouth every 6 (six) hours as needed. 07/02/15   Derwood Kaplan, MD  ibuprofen (ADVIL,MOTRIN) 600 MG tablet Take 1 tablet (600 mg total) by mouth every 6 (six) hours as needed. 07/02/15   Derwood Kaplan, MD  penicillin v potassium (VEETID) 250 MG tablet Take 1 tablet (250 mg total) by mouth 4 (four) times daily. 07/02/15 07/09/15  Ankit Rhunette Croft, MD   BP 131/84 mmHg  Pulse 50  Temp(Src) 97.5 F (36.4 C) (Oral)  Resp 18  Ht  (1.778 m)  Wt 203 lb 4 oz (92.194 kg)  BMI 29.16 kg/m2  SpO2 98%   Physical Exam   General: Well-developed, well-nourished male in no acute distress; appearance consistent with age of record HENT: normocephalic; atraumatic; carious right lower second molar, tender to percussion; carious left lower first molar, nontender Eyes: pupils equal, round and reactive to light; extraocular muscles intact Neck: supple; no lymphadenopathy Heart: regular rate and rhythm Lungs: clear to auscultation bilaterally Abdomen: soft; nondistended Extremities: No deformity; full range of motion Neurologic: Awake, alert and oriented; motor function intact in all extremities and symmetric; no facial droop Skin: Warm and dry Psychiatric: Normal mood and affect    ED Course  Procedures (including critical care time)  DENTAL BLOCK  1.8 milliliters of 0.5% bupivacaine with epinephrine were injected into the buccal fold adjacent to the right lower second molar. The patient tolerated this well and there were no immediate complications.  MDM  We will write for a small number of Percocet tablets as hydrocodone has been inadequate. He was advised to contact the dentist whom he was referred first thing this morning. He was advised that if he keeps returning to the ED we will not be prescribing further narcotics.    Paula Libra, MD 07/03/15 [phone]

## 2015-07-03 NOTE — Discharge Instructions (Signed)

## 2015-07-03 NOTE — ED Notes (Signed)
Patient states that nothing has helped the pain that he was here for less than 12 hours ago. The patient states " I cant do nuthin, I cant lay down or chill, eat or nuthin. I have taken all the meds and nuttin is helping"

## 2015-11-21 ENCOUNTER — Emergency Department (HOSPITAL_BASED_OUTPATIENT_CLINIC_OR_DEPARTMENT_OTHER)
Admission: EM | Admit: 2015-11-21 | Discharge: 2015-11-21 | Disposition: A | Discharge status: Home / Self Care | Payer: Self-pay | Attending: Emergency Medicine | Admitting: Emergency Medicine

## 2015-11-21 ENCOUNTER — Encounter (HOSPITAL_BASED_OUTPATIENT_CLINIC_OR_DEPARTMENT_OTHER): Payer: Self-pay | Admitting: Emergency Medicine

## 2015-11-21 DIAGNOSIS — M546 Pain in thoracic spine: Secondary | ICD-10-CM | POA: Insufficient documentation

## 2015-11-21 DIAGNOSIS — M62838 Other muscle spasm: Secondary | ICD-10-CM | POA: Insufficient documentation

## 2015-11-21 DIAGNOSIS — F17210 Nicotine dependence, cigarettes, uncomplicated: Secondary | ICD-10-CM | POA: Insufficient documentation

## 2015-11-21 MED ORDER — CYCLOBENZAPRINE HCL 10 MG PO TABS: 10.0000 mg | ORAL_TABLET | Freq: Once | ORAL | Status: DC

## 2015-11-21 MED ORDER — NAPROXEN 250 MG PO TABS
500.0000 mg | ORAL_TABLET | Freq: Once | ORAL | Status: AC
  Administered 2015-11-21: 500 mg via ORAL
  Filled 2015-11-21: qty 2

## 2015-11-21 MED ORDER — CYCLOBENZAPRINE HCL 10 MG PO TABS: 10.0000 mg | ORAL_TABLET | Freq: Three times a day (TID) | ORAL | Status: DC | PRN

## 2015-11-21 MED ORDER — NAPROXEN 500 MG PO TABS: ORAL_TABLET | ORAL | Status: DC

## 2015-11-21 NOTE — ED Notes (Signed)
Pt verbalizes understanding of d/c instructions and denies any further needs at this time. 

## 2015-11-21 NOTE — ED Notes (Signed)
Middle neck and upper back pain, worse with movement.  Pain started four days ago, no n/v, no fevers, no photosensitivity, pain not relieved completely by ibuprofen or tylenol.

## 2015-11-21 NOTE — ED Notes (Signed)
Per Dr. Read DriversMolpus when he went in to see pt, he was comfortably asleep on bed.

## 2015-11-21 NOTE — ED Provider Notes (Addendum)
CSN: [id number]     Arrival date & time 11/21/15  0101 History   First MD Initiated Contact with Patient 11/21/15 0148     Chief Complaint  Patient presents with  . Neck Pain     (Consider location/radiation/quality/duration/timing/severity/associated sxs/prior Treatment) HPI  This is a [age] year old male with a four-day history of neck pain. The pain is located in the muscles of the posterior neck. Pain is worse with flexion and extension of the neck. There is no radiation of the pain to the shoulders or arms. The pain does radiate upwards to his head and has caused headaches. He denies injury to his neck. He is taken ibuprofen and acetaminophen without relief. He denies fever, chills, nausea, vomiting, numbness or weakness.  History reviewed. No pertinent past medical history. History reviewed. No pertinent past surgical history. History reviewed. No pertinent family history. Social History  Substance Use Topics  . Smoking status: Current Every Day Smoker -- 0.50 packs/day for 5 years    Types: Cigarettes  . Smokeless tobacco: Never Used  . Alcohol Use: Yes     Comment: occ    Review of Systems  All other systems reviewed and are negative.   Allergies  Review of patient's allergies indicates no known allergies.  Home Medications   Prior to Admission medications   Medication Sig Start Date End Date Taking? Authorizing Provider  HYDROcodone-acetaminophen (NORCO/VICODIN) 5-325 MG tablet Take 1 tablet by mouth every 6 (six) hours as needed. 07/02/15   Derwood KaplanAnkit Nanavati, MD  ibuprofen (ADVIL,MOTRIN) 600 MG tablet Take 1 tablet (600 mg total) by mouth every 6 (six) hours as needed. 07/02/15   Derwood KaplanAnkit Nanavati, MD  oxyCODONE-acetaminophen (PERCOCET) 5-325 MG tablet Take 1-2 tablets by mouth every 6 (six) hours as needed (for pain). 07/03/15   Louay Myrie, MD   BP 112/73 mmHg  Pulse 68  Temp(Src) 98.3 F (36.8 C) (Oral)  Resp 16  Wt 202 lb (91.627 kg)  SpO2 100%   Physical Exam   General: Well-developed, well-nourished male in no acute distress; appearance consistent with age of record HENT: normocephalic; atraumatic Eyes: pupils equal, round and reactive to light; extraocular muscles intact Neck: supple but range of motion limited due to pain; tenderness and tightness of the muscles of the posterior neck Heart: regular rate and rhythm Lungs: clear to auscultation bilaterally Abdomen: soft; nondistended; nontender; bowel sounds present Extremities: No deformity; full range of motion; pulses normal Neurologic: Awake, alert and oriented; motor function intact in all extremities and symmetric; no facial droop Skin: Warm and dry Psychiatric: Flat affect    ED Course  Procedures (including critical care time)   MDM     Paula LibraJohn Freedom Peddy, MD 11/21/15 0201  Paula LibraJohn Cannon Quinton, MD 11/21/15 [phone]

## 2015-11-21 NOTE — ED Notes (Signed)
Pt reports onset of neck pain and headaches that increase with bending over x 4 days

## 2016-05-21 ENCOUNTER — Emergency Department (HOSPITAL_BASED_OUTPATIENT_CLINIC_OR_DEPARTMENT_OTHER)
Admission: EM | Admit: 2016-05-21 | Discharge: 2016-05-21 | Disposition: A | Discharge status: Home / Self Care | Payer: Self-pay | Attending: Emergency Medicine | Admitting: Emergency Medicine

## 2016-05-21 ENCOUNTER — Emergency Department (HOSPITAL_BASED_OUTPATIENT_CLINIC_OR_DEPARTMENT_OTHER): Payer: Self-pay

## 2016-05-21 ENCOUNTER — Encounter (HOSPITAL_BASED_OUTPATIENT_CLINIC_OR_DEPARTMENT_OTHER): Payer: Self-pay | Admitting: *Deleted

## 2016-05-21 DIAGNOSIS — R509 Fever, unspecified: Secondary | ICD-10-CM | POA: Insufficient documentation

## 2016-05-21 DIAGNOSIS — R51 Headache: Secondary | ICD-10-CM | POA: Insufficient documentation

## 2016-05-21 DIAGNOSIS — J111 Influenza due to unidentified influenza virus with other respiratory manifestations: Secondary | ICD-10-CM

## 2016-05-21 DIAGNOSIS — R05 Cough: Secondary | ICD-10-CM | POA: Insufficient documentation

## 2016-05-21 DIAGNOSIS — R69 Illness, unspecified: Secondary | ICD-10-CM

## 2016-05-21 DIAGNOSIS — F17210 Nicotine dependence, cigarettes, uncomplicated: Secondary | ICD-10-CM | POA: Insufficient documentation

## 2016-05-21 DIAGNOSIS — R110 Nausea: Secondary | ICD-10-CM | POA: Insufficient documentation

## 2016-05-21 MED ORDER — FLUTICASONE PROPIONATE 50 MCG/ACT NA SUSP: 2.0000 | Freq: Every day | NASAL | 0 refills | Status: DC

## 2016-05-21 MED ORDER — FLUTICASONE PROPIONATE 50 MCG/ACT NA SUSP
2.0000 | Freq: Every day | NASAL | Status: DC
  Filled 2016-05-21: qty 16

## 2016-05-21 MED ORDER — KETOROLAC TROMETHAMINE 60 MG/2ML IM SOLN
60.0000 mg | Freq: Once | INTRAMUSCULAR | Status: AC
  Administered 2016-05-21: 60 mg via INTRAMUSCULAR
  Filled 2016-05-21: qty 2

## 2016-05-21 MED ORDER — BENZONATATE 100 MG PO CAPS: 100.0000 mg | ORAL_CAPSULE | Freq: Three times a day (TID) | ORAL | 0 refills | Status: DC

## 2016-05-21 MED ORDER — CYCLOBENZAPRINE HCL 10 MG PO TABS: 10.0000 mg | ORAL_TABLET | Freq: Two times a day (BID) | ORAL | 0 refills | Status: DC | PRN

## 2016-05-21 MED ORDER — PROMETHAZINE HCL 25 MG PO TABS: 25.0000 mg | ORAL_TABLET | Freq: Four times a day (QID) | ORAL | 0 refills | Status: DC | PRN

## 2016-05-21 MED ORDER — BENZONATATE 100 MG PO CAPS
200.0000 mg | ORAL_CAPSULE | Freq: Once | ORAL | Status: AC
  Administered 2016-05-21: 200 mg via ORAL
  Filled 2016-05-21: qty 2

## 2016-05-21 NOTE — ED Triage Notes (Signed)
C/o runny nose x 2 days and body aches, chills, NP cough.

## 2016-05-21 NOTE — ED Provider Notes (Signed)
[facility]   CSN: [id number] Arrival date & time: 05/21/16  0813     History   Chief Complaint Chief Complaint  Patient presents with  . Nasal Congestion    HPI Chase Ball is a 31 y.o. male.  HPI   3 days with nasal congestion, body aches severe, cough, headache, back pain, chills. Dry cough. Yellow nasal congestion  Temperature up to 102 at home, 99 here, goes up and down  Did have sick contacts but they are better now  Back pain across whole lower back  No hx of IVDU  Girlflriend was diagnosed with influenza  History reviewed. No pertinent past medical history.  There are no active problems to display for this patient.   History reviewed. No pertinent surgical history.     Home Medications    Prior to Admission medications   Medication Sig Start Date End Date Taking? Authorizing Provider  benzonatate (TESSALON) 100 MG capsule Take 1 capsule (100 mg total) by mouth every 8 (eight) hours. 05/21/16   Alvira Monday, MD  cyclobenzaprine (FLEXERIL) 10 MG tablet Take 1 tablet (10 mg total) by mouth 2 (two) times daily as needed for muscle spasms. 05/21/16   Alvira Monday, MD  fluticasone (FLONASE) 50 MCG/ACT nasal spray Place 2 sprays into both nostrils daily. 05/21/16   Alvira Monday, MD  promethazine (PHENERGAN) 25 MG tablet Take 1 tablet (25 mg total) by mouth every 6 (six) hours as needed for nausea or vomiting. 05/21/16   Alvira Monday, MD    Family History No family history on file.  Social History Social History  Substance Use Topics  . Smoking status: Current Every Day Smoker    Packs/day: 0.50    Years: 5.00    Types: Cigarettes  . Smokeless tobacco: Never Used  . Alcohol use Yes     Comment: occ     Allergies   Patient has no known allergies.   Review of Systems Review of Systems  Constitutional: Positive for chills, fatigue and fever.  HENT: Positive for congestion. Negative for ear pain, sore throat  and voice change.   Eyes: Negative for visual disturbance.  Respiratory: Positive for cough.   Cardiovascular: Negative for chest pain.  Gastrointestinal: Positive for nausea. Negative for abdominal pain, constipation, diarrhea and vomiting.  Genitourinary: Negative for difficulty urinating and dysuria.  Musculoskeletal: Positive for back pain and myalgias.  Skin: Negative for rash.  Neurological: Positive for headaches. Negative for weakness and numbness.     Physical Exam Updated Vital Signs BP 140/81 (BP Location: Right Arm)   Pulse 105   Temp 99.9 F (37.7 C) (Oral)   Resp 20   Ht 5\' 10"  (1.778 m)   Wt 200 lb (90.7 kg)   SpO2 95%   BMI 28.70 kg/m   Physical Exam  Constitutional: He is oriented to person, place, and time. He appears well-developed and well-nourished. No distress.  HENT:  Head: Normocephalic and atraumatic.  Eyes: Conjunctivae and EOM are normal.  Neck: Normal range of motion.  Cardiovascular: Regular rhythm, normal heart sounds and intact distal pulses.  Tachycardia present.  Exam reveals no gallop and no friction rub.   No murmur heard. Pulmonary/Chest: Effort normal and breath sounds normal. No respiratory distress. He has no wheezes. He has no rales.  Abdominal: Soft. He exhibits no distension. There is no tenderness. There is no guarding.  Musculoskeletal: He exhibits no edema.  Neurological: He is alert and oriented to person, place,  and time.  Skin: Skin is warm and dry. He is not diaphoretic.  Nursing note and vitals reviewed.    ED Treatments / Results  Labs (all labs ordered are listed, but only abnormal results are displayed) Labs Reviewed - No data to display  EKG  EKG Interpretation None       Radiology Dg Chest 2 View  Result Date: 05/21/2016 CLINICAL DATA:  Runny nose, cough, chills EXAM: CHEST  2 VIEW COMPARISON:  None. FINDINGS: Heart and mediastinal contours are within normal limits. No focal opacities or effusions. No acute  bony abnormality. IMPRESSION: No active cardiopulmonary disease. Electronically Signed   By: Charlett NoseKevin  Dover M.D.   On: 05/21/2016 09:25    Procedures Procedures (including critical care time)  Medications Ordered in ED Medications  fluticasone (FLONASE) 50 MCG/ACT nasal spray 2 spray (2 sprays Each Nare Not Given 05/21/16 0937)  benzonatate (TESSALON) capsule 200 mg (200 mg Oral Given 05/21/16 0933)  ketorolac (TORADOL) injection 60 mg (60 mg Intramuscular Given 05/21/16 0933)     Initial Impression / Assessment and Plan / ED Course  I have reviewed the triage vital signs and the nursing notes.  Pertinent labs & imaging results that were available during my care of the patient were reviewed by me and considered in my medical decision making (see chart for details).  Clinical Course    [age]year old male presents with concern for cough, nasal congestion, body aches and fever.  CXR shows no sign of pneumonia. No sign of otitis media. Doubt bacterial sinusitis given duration of symptoms. No sign of meningitis.  Patient with sick contact and suspect influenza. Do not feel tamiflu is indicated given age and lack of co morbidities and recommend supportive care.  Recommend ibuprofen, tylenol and gave rx for flexeril, phenergan tessalon and flonase. Patient discharged in stable condition with understanding of reasons to return.   Final Clinical Impressions(s) / ED Diagnoses   Final diagnoses:  Influenza-like illness    New Prescriptions New Prescriptions   BENZONATATE (TESSALON) 100 MG CAPSULE    Take 1 capsule (100 mg total) by mouth every 8 (eight) hours.   CYCLOBENZAPRINE (FLEXERIL) 10 MG TABLET    Take 1 tablet (10 mg total) by mouth 2 (two) times daily as needed for muscle spasms.   FLUTICASONE (FLONASE) 50 MCG/ACT NASAL SPRAY    Place 2 sprays into both nostrils daily.   PROMETHAZINE (PHENERGAN) 25 MG TABLET    Take 1 tablet (25 mg total) by mouth every 6 (six) hours as needed for nausea or  vomiting.     Alvira MondayErin Ivis Nicolson, MD 05/21/16 [phone]

## 2016-08-17 ENCOUNTER — Emergency Department (HOSPITAL_BASED_OUTPATIENT_CLINIC_OR_DEPARTMENT_OTHER)
Admission: EM | Admit: 2016-08-17 | Discharge: 2016-08-17 | Disposition: A | Discharge status: Home / Self Care | Payer: Self-pay | Attending: Emergency Medicine | Admitting: Emergency Medicine

## 2016-08-17 ENCOUNTER — Encounter (HOSPITAL_BASED_OUTPATIENT_CLINIC_OR_DEPARTMENT_OTHER): Payer: Self-pay | Admitting: Emergency Medicine

## 2016-08-17 DIAGNOSIS — K029 Dental caries, unspecified: Secondary | ICD-10-CM | POA: Insufficient documentation

## 2016-08-17 DIAGNOSIS — F17210 Nicotine dependence, cigarettes, uncomplicated: Secondary | ICD-10-CM | POA: Insufficient documentation

## 2016-08-17 MED ORDER — BUPIVACAINE-EPINEPHRINE (PF) 0.5% -1:200000 IJ SOLN
1.8000 mL | Freq: Once | INTRAMUSCULAR | Status: AC
  Administered 2016-08-17: 1.8 mL

## 2016-08-17 MED ORDER — BUPIVACAINE-EPINEPHRINE (PF) 0.5% -1:200000 IJ SOLN
INTRAMUSCULAR | Status: AC
  Filled 2016-08-17: qty 1.8

## 2016-08-17 MED ORDER — PENICILLIN V POTASSIUM 500 MG PO TABS: 500.0000 mg | ORAL_TABLET | Freq: Three times a day (TID) | ORAL | 0 refills | Status: DC

## 2016-08-17 MED ORDER — IBUPROFEN 800 MG PO TABS
800.0000 mg | ORAL_TABLET | Freq: Once | ORAL | Status: AC
  Administered 2016-08-17: 800 mg via ORAL
  Filled 2016-08-17: qty 1

## 2016-08-17 MED ORDER — BUPIVACAINE HCL 0.25 % IJ SOLN
20.0000 mL | Freq: Once | INTRAMUSCULAR | Status: DC
  Filled 2016-08-17: qty 1

## 2016-08-17 MED ORDER — IBUPROFEN 800 MG PO TABS: 800.0000 mg | ORAL_TABLET | Freq: Three times a day (TID) | ORAL | 0 refills | Status: DC

## 2016-08-17 NOTE — ED Provider Notes (Signed)
[facility]   CSN: [id number] Arrival date & time: 08/17/16  4098     History   Chief Complaint Chief Complaint  Patient presents with  . Dental Pain    HPI Chase Ball is a 31 y.o. male.  HPI Pt reports increasing dental pain x 3 days and feels like a piece of his molar broke. No other complaints. No facial swelling. Pain is moderate in severity. No fevers. No difficulty breathing or swallowing   History reviewed. No pertinent past medical history.  There are no active problems to display for this patient.   History reviewed. No pertinent surgical history.     Home Medications    Prior to Admission medications   Medication Sig Start Date End Date Taking? Authorizing Provider  benzonatate (TESSALON) 100 MG capsule Take 1 capsule (100 mg total) by mouth every 8 (eight) hours. 05/21/16   Alvira Monday, MD  cyclobenzaprine (FLEXERIL) 10 MG tablet Take 1 tablet (10 mg total) by mouth 2 (two) times daily as needed for muscle spasms. 05/21/16   Alvira Monday, MD  fluticasone (FLONASE) 50 MCG/ACT nasal spray Place 2 sprays into both nostrils daily. 05/21/16   Alvira Monday, MD  promethazine (PHENERGAN) 25 MG tablet Take 1 tablet (25 mg total) by mouth every 6 (six) hours as needed for nausea or vomiting. 05/21/16   Alvira Monday, MD    Family History History reviewed. No pertinent family history.  Social History Social History  Substance Use Topics  . Smoking status: Current Every Day Smoker    Packs/day: 0.50    Years: 5.00    Types: Cigarettes  . Smokeless tobacco: Never Used  . Alcohol use Yes     Comment: occ     Allergies   Patient has no known allergies.   Review of Systems Review of Systems  All other systems reviewed and are negative.    Physical Exam Updated Vital Signs BP 133/72 (BP Location: Right Arm)   Pulse 70   Temp 97.6 F (36.4 C) (Oral)   Resp 18   Ht  (1.778 m)   Wt 205 lb (93 kg)   SpO2 98%    BMI 29.41 kg/m   Physical Exam  Constitutional: He is oriented to person, place, and time. He appears well-developed and well-nourished.  HENT:  Head: Normocephalic.  Right lower 2nd molar with dental decay and mild tenderness. No gingival swelling or flucuance  Eyes: EOM are normal.  Neck: Normal range of motion.  Pulmonary/Chest: Effort normal.  Abdominal: He exhibits no distension.  Musculoskeletal: Normal range of motion.  Neurological: He is alert and oriented to person, place, and time.  Psychiatric: He has a normal mood and affect.  Nursing note and vitals reviewed.    ED Treatments / Results  Labs (all labs ordered are listed, but only abnormal results are displayed) Labs Reviewed - No data to display  EKG  EKG Interpretation None       Radiology No results found.  Procedures .Nerve Block Date/Time: 08/17/2016 7:57 AM Performed by: Azalia Bilis Authorized by: Azalia Bilis    Consent: Verbal consent obtained. Required items: required blood products, implants, devices, and special equipment available Time out: Immediately prior to procedure a "time out" was called to verify the correct patient, procedure, equipment, support staff and site/side marked as required. Indication: right lower dental pain Nerve block body site: dental nerve Preparation: Patient was prepped and draped in the usual sterile fashion. Needle gauge:  24 G Location technique: anatomical landmarks Local anesthetic: marcaine 0.5% with epi Anesthetic total: 1.8 ml Outcome: pain improved Patient tolerance: Patient tolerated the procedure well with no immediate complications.    Medications Ordered in ED Medications  bupivacaine (MARCAINE) 0.25 % (with pres) injection 20 mL (20 mLs Infiltration Not Given 08/17/16 0742)  bupivacaine-epinephrine (MARCAINE W/ EPI) 0.5% -1:200000 injection 1.8 mL (1.8 mLs Infiltration Given by Other 08/17/16 0749)     Initial Impression / Assessment and  Plan / ED Course  I have reviewed the triage vital signs and the nursing notes.  Pertinent labs & imaging results that were available during my care of the patient were reviewed by me and considered in my medical decision making (see chart for details).     Dental Pain. Home with antibiotics and pain medicine. Recommend dental follow up. No signs of gingival abscess. Tolerating secretions. Airway patent. No sub lingular swelling   Final Clinical Impressions(s) / ED Diagnoses   Final diagnoses:  None    New Prescriptions New Prescriptions   No medications on file     Azalia Bilis, MD 08/17/16 [phone]

## 2016-08-17 NOTE — ED Notes (Signed)
Pt made aware to return if symptoms worsen or if any life threatening symptoms occur.   

## 2016-08-17 NOTE — ED Triage Notes (Addendum)
Pt reports lower right dental pain x2 days radiating to right ear with h/a.  Pt was chewing and part of tooth broke off. Denies fever/chills. Airway patent.

## 2016-11-16 ENCOUNTER — Emergency Department (HOSPITAL_BASED_OUTPATIENT_CLINIC_OR_DEPARTMENT_OTHER)
Admission: EM | Admit: 2016-11-16 | Discharge: 2016-11-16 | Disposition: A | Discharge status: Home / Self Care | Payer: Self-pay | Attending: Emergency Medicine | Admitting: Emergency Medicine

## 2016-11-16 ENCOUNTER — Encounter (HOSPITAL_BASED_OUTPATIENT_CLINIC_OR_DEPARTMENT_OTHER): Payer: Self-pay | Admitting: Emergency Medicine

## 2016-11-16 DIAGNOSIS — F17210 Nicotine dependence, cigarettes, uncomplicated: Secondary | ICD-10-CM | POA: Insufficient documentation

## 2016-11-16 DIAGNOSIS — H5713 Ocular pain, bilateral: Secondary | ICD-10-CM | POA: Insufficient documentation

## 2016-11-16 DIAGNOSIS — R55 Syncope and collapse: Secondary | ICD-10-CM | POA: Insufficient documentation

## 2016-11-16 LAB — BASIC METABOLIC PANEL
Anion gap: 6 (ref 5–15)
BUN: 11 mg/dL (ref 6–20)
CALCIUM: 8.8 mg/dL — AB (ref 8.9–10.3)
CO2: 28 mmol/L (ref 22–32)
CREATININE: 0.89 mg/dL (ref 0.61–1.24)
Chloride: 104 mmol/L (ref 101–111)
GFR calc Af Amer: >60 mL/min (ref >60)
Glucose, Bld: 106 mg/dL — ABNORMAL HIGH (ref 65–99)
Potassium: 3.9 mmol/L (ref 3.5–5.1)
Sodium: 138 mmol/L (ref 135–145)

## 2016-11-16 LAB — CBC WITH DIFFERENTIAL/PLATELET
BASOS PCT: 1 %
Basophils Absolute: 0 10*3/uL (ref 0.0–0.1)
Eosinophils Absolute: 0.3 10*3/uL (ref 0.0–0.7)
Eosinophils Relative: 6 %
HEMATOCRIT: 43.6 % (ref 39.0–52.0)
Hemoglobin: 15.7 g/dL (ref 13.0–17.0)
LYMPHS PCT: 49 %
Lymphs Abs: 2.5 10*3/uL (ref 0.7–4.0)
MCH: 31.3 pg (ref 26.0–34.0)
MCHC: 36 g/dL (ref 30.0–36.0)
MCV: 86.9 fL (ref 78.0–100.0)
MONO ABS: 0.5 10*3/uL (ref 0.1–1.0)
MONOS PCT: 11 %
NEUTROS ABS: 1.7 10*3/uL (ref 1.7–7.7)
Neutrophils Relative %: 33 %
PLATELETS: 291 10*3/uL (ref 150–400)
RBC: 5.02 MIL/uL (ref 4.22–5.81)
RDW: 12 % (ref 11.5–15.5)
WBC: 5.1 10*3/uL (ref 4.0–10.5)

## 2016-11-16 MED ORDER — CETIRIZINE HCL 10 MG PO TABS: 10.0000 mg | ORAL_TABLET | Freq: Every day | ORAL | 0 refills | Status: AC

## 2016-11-16 NOTE — ED Provider Notes (Signed)
[facility]   CSN: [id number] Arrival date & time: 11/16/16  [id number]     History   Chief Complaint Chief Complaint  Patient presents with  . Loss of Consciousness  . Eye Problem    HPI Chase AlvineMarcus Ball is a 31 y.o. male.  HPI Patient presents with syncope. States he went to work which is very hot. States he been around a half hour and he began to feel lightheaded after standing. States he's had this happen before. States he then passed out. Disease to the ground by coworkers. No chest pain. No trouble breathing. States he's had this happen once before. No other lightheadedness or dizziness. No nausea vomiting. States he gets what he was happening but feels better now.  Also states that his eyes hurt in the morning. States he gets up in their burning. States he presented crusty eyes in the morning but not recently. States he started allergy drops without relief. History reviewed. No pertinent past medical history.  There are no active problems to display for this patient.   History reviewed. No pertinent surgical history.     Home Medications    Prior to Admission medications   Medication Sig Start Date End Date Taking? Authorizing Provider  cetirizine (ZYRTEC) 10 MG tablet Take 1 tablet (10 mg total) by mouth daily. 11/16/16   Benjiman CorePickering, Michaeljohn Biss, MD    Family History No family history on file.  Social History Social History  Substance Use Topics  . Smoking status: Current Every Day Smoker    Packs/day: 0.00    Years: 0.00    Types: Cigarettes, Cigars  . Smokeless tobacco: Never Used  . Alcohol use Yes     Comment: occ     Allergies   Patient has no known allergies.   Review of Systems Review of Systems  Constitutional: Negative for appetite change, chills and fever.  HENT: Negative for ear discharge, ear pain, hearing loss and tinnitus.   Eyes: Positive for pain. Negative for photophobia.  Respiratory: Negative for cough.     Cardiovascular: Negative for chest pain and palpitations.  Gastrointestinal: Negative for abdominal pain, blood in stool, constipation, diarrhea, nausea and vomiting.  Genitourinary: Negative for dysuria, frequency and urgency.  Musculoskeletal: Negative for myalgias and neck pain.  Skin: Negative for rash.  Neurological: Positive for syncope. Negative for dizziness, tremors and headaches.  Hematological: Does not bruise/bleed easily.     Physical Exam Updated Vital Signs BP 108/75   Pulse 70   Temp 97.9 F (36.6 C) (Oral)   Resp 19   Ht 5\' 10"  (1.778 m)   Wt 96.2 kg (212 lb)   SpO2 99%   BMI 30.42 kg/m   Physical Exam  Constitutional: He appears well-developed.  HENT:  Head: Atraumatic.  Eyes: Conjunctivae are normal. Pupils are equal, round, and reactive to light.  Cardiovascular: Normal rate.   No murmur heard. Pulmonary/Chest: Effort normal.  Abdominal: Soft.  Musculoskeletal: He exhibits no edema.  Neurological: He is alert.  Skin: Skin is warm. Capillary refill takes less than 2 seconds.     ED Treatments / Results  Labs (all labs ordered are listed, but only abnormal results are displayed) Labs Reviewed  BASIC METABOLIC PANEL - Abnormal; Notable for the following:       Result Value   Glucose, Bld 106 (*)    Calcium 8.8 (*)    All other components within normal limits  CBC WITH DIFFERENTIAL/PLATELET    EKG  EKG Interpretation  Date/Time:  Tuesday November 16 2016 07:36:27 EDT Ventricular Rate:  57 PR Interval:    QRS Duration: 95 QT Interval:  414 QTC Calculation: 404 R Axis:   64 Text Interpretation:  Sinus rhythm ST elev, probable normal early repol pattern Confirmed by Rubin Payor  MD, Haitham Dolinsky [phone]) on 11/16/2016 8:10:47 AM       Radiology No results found.  Procedures Procedures (including critical care time)  Medications Ordered in ED Medications - No data to display   Initial Impression / Assessment and Plan / ED Course  I have  reviewed the triage vital signs and the nursing notes.  Pertinent labs & imaging results that were available during my care of the patient were reviewed by me and considered in my medical decision making (see chart for details).     Patient was syncope. Began a hot environment. EKG reassuring. No murmur. Not orthostatic. Has been tolerating orals. Will discharge home. Zyrtec given for possible allergies to eyes.  Final Clinical Impressions(s) / ED Diagnoses   Final diagnoses:  Syncope, unspecified syncope type    New Prescriptions Discharge Medication List as of 11/16/2016  9:15 AM    START taking these medications   Details  cetirizine (ZYRTEC) 10 MG tablet Take 1 tablet (10 mg total) by mouth daily., Starting Tue 11/16/2016, Print         Benjiman Core, MD 11/16/16 [phone]

## 2016-11-16 NOTE — ED Triage Notes (Signed)
Pt states he passed out at work this morning at 0630. Pt states he works in a hot environment. Pt also c/o eye irritation that is worse int he mornings.

## 2016-11-16 NOTE — ED Notes (Signed)
ED Provider at bedside. 

## 2016-11-16 NOTE — ED Notes (Signed)
Pt drinking gatorade.

## 2016-11-16 NOTE — Discharge Instructions (Signed)
Trying keep herself hydrated. The eye pain in the morning potentially could be due to allergies. We will try Zyrtec and see if that helps. Follow-up with the primary care doctor.

## 2017-06-16 IMAGING — CR DG CHEST 2V
2 series · 2 of 2 positions shown · non-contrast
Comparison: None.

CLINICAL DATA: Runny nose, cough, chills

EXAM:
CHEST  2 VIEW

[w chest pa]
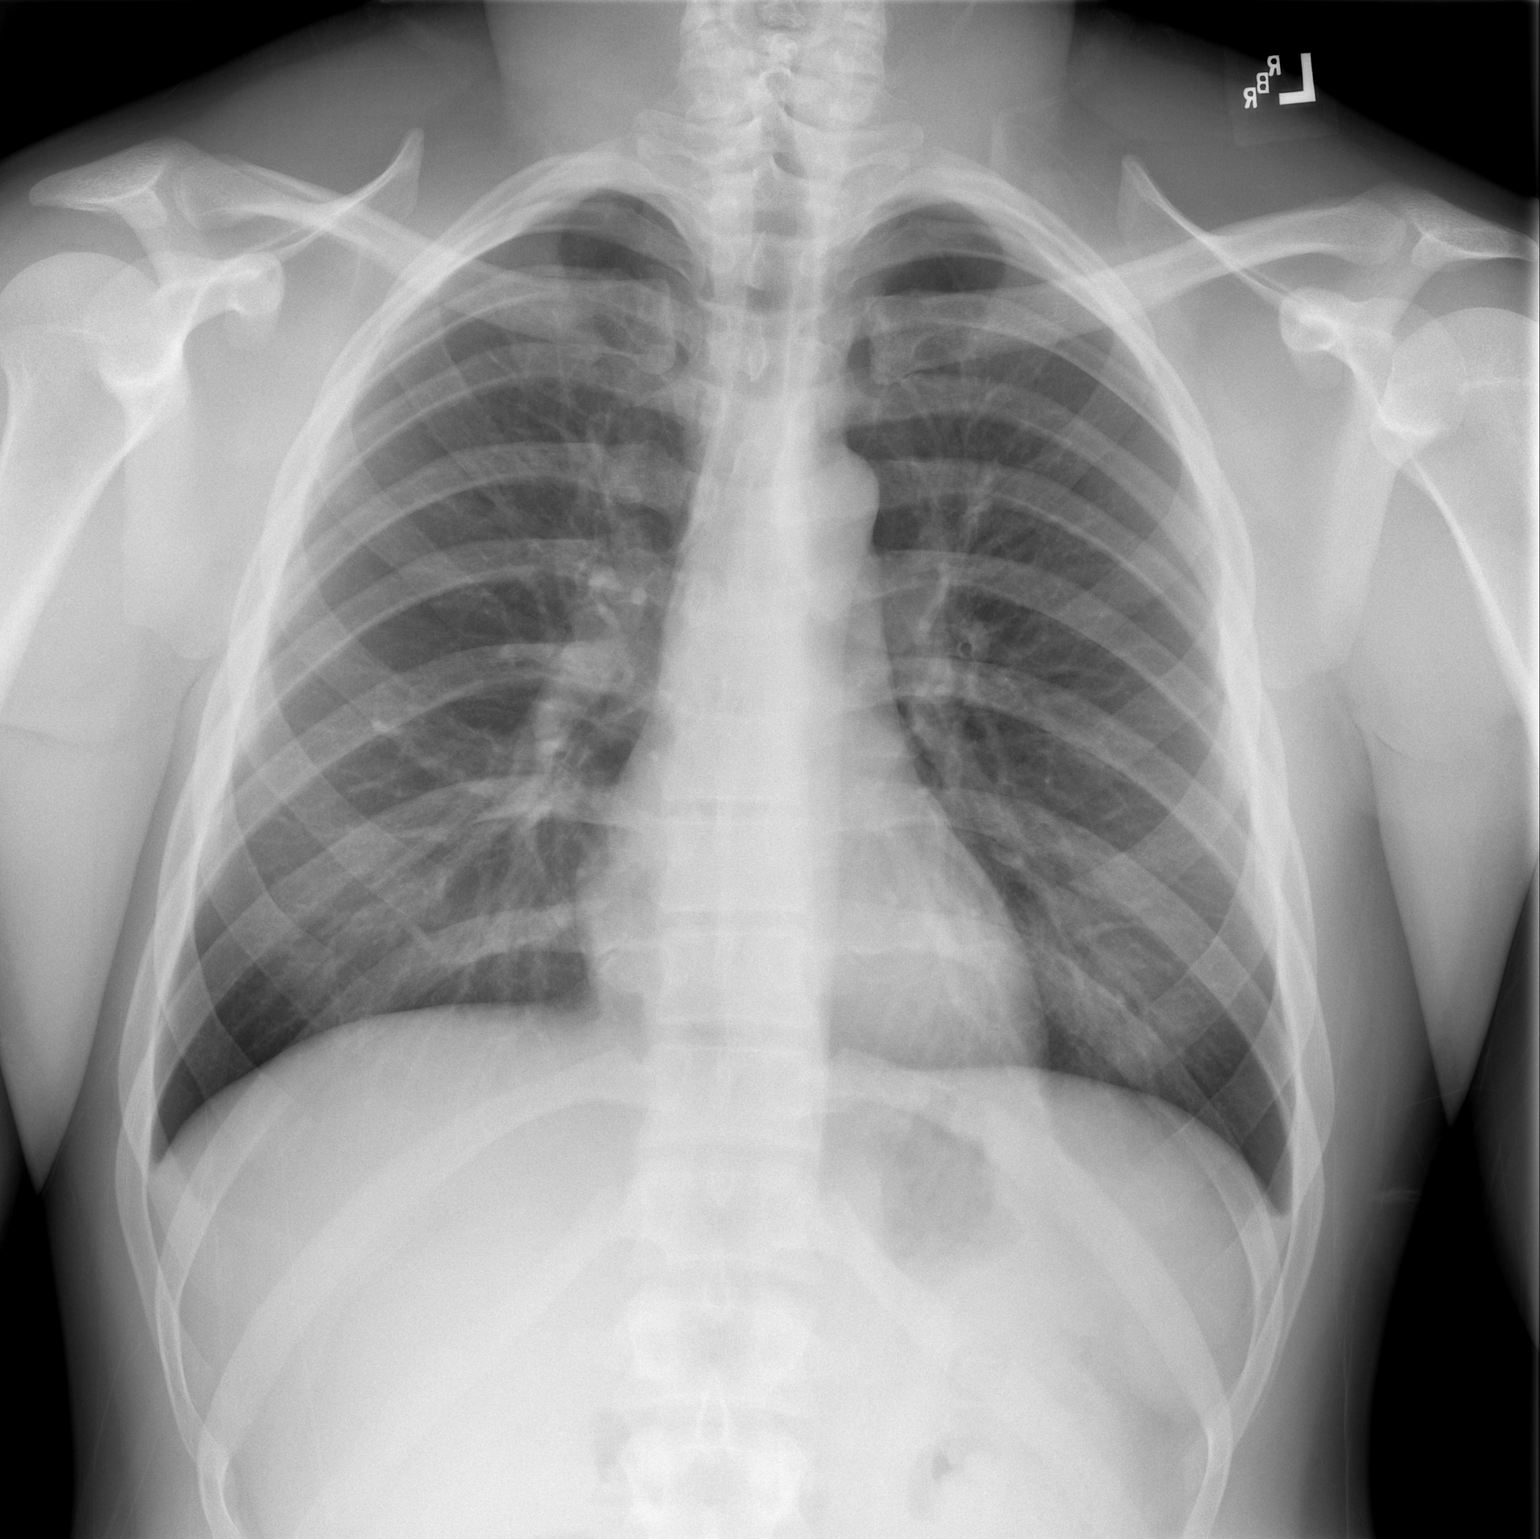

[w chest lat]
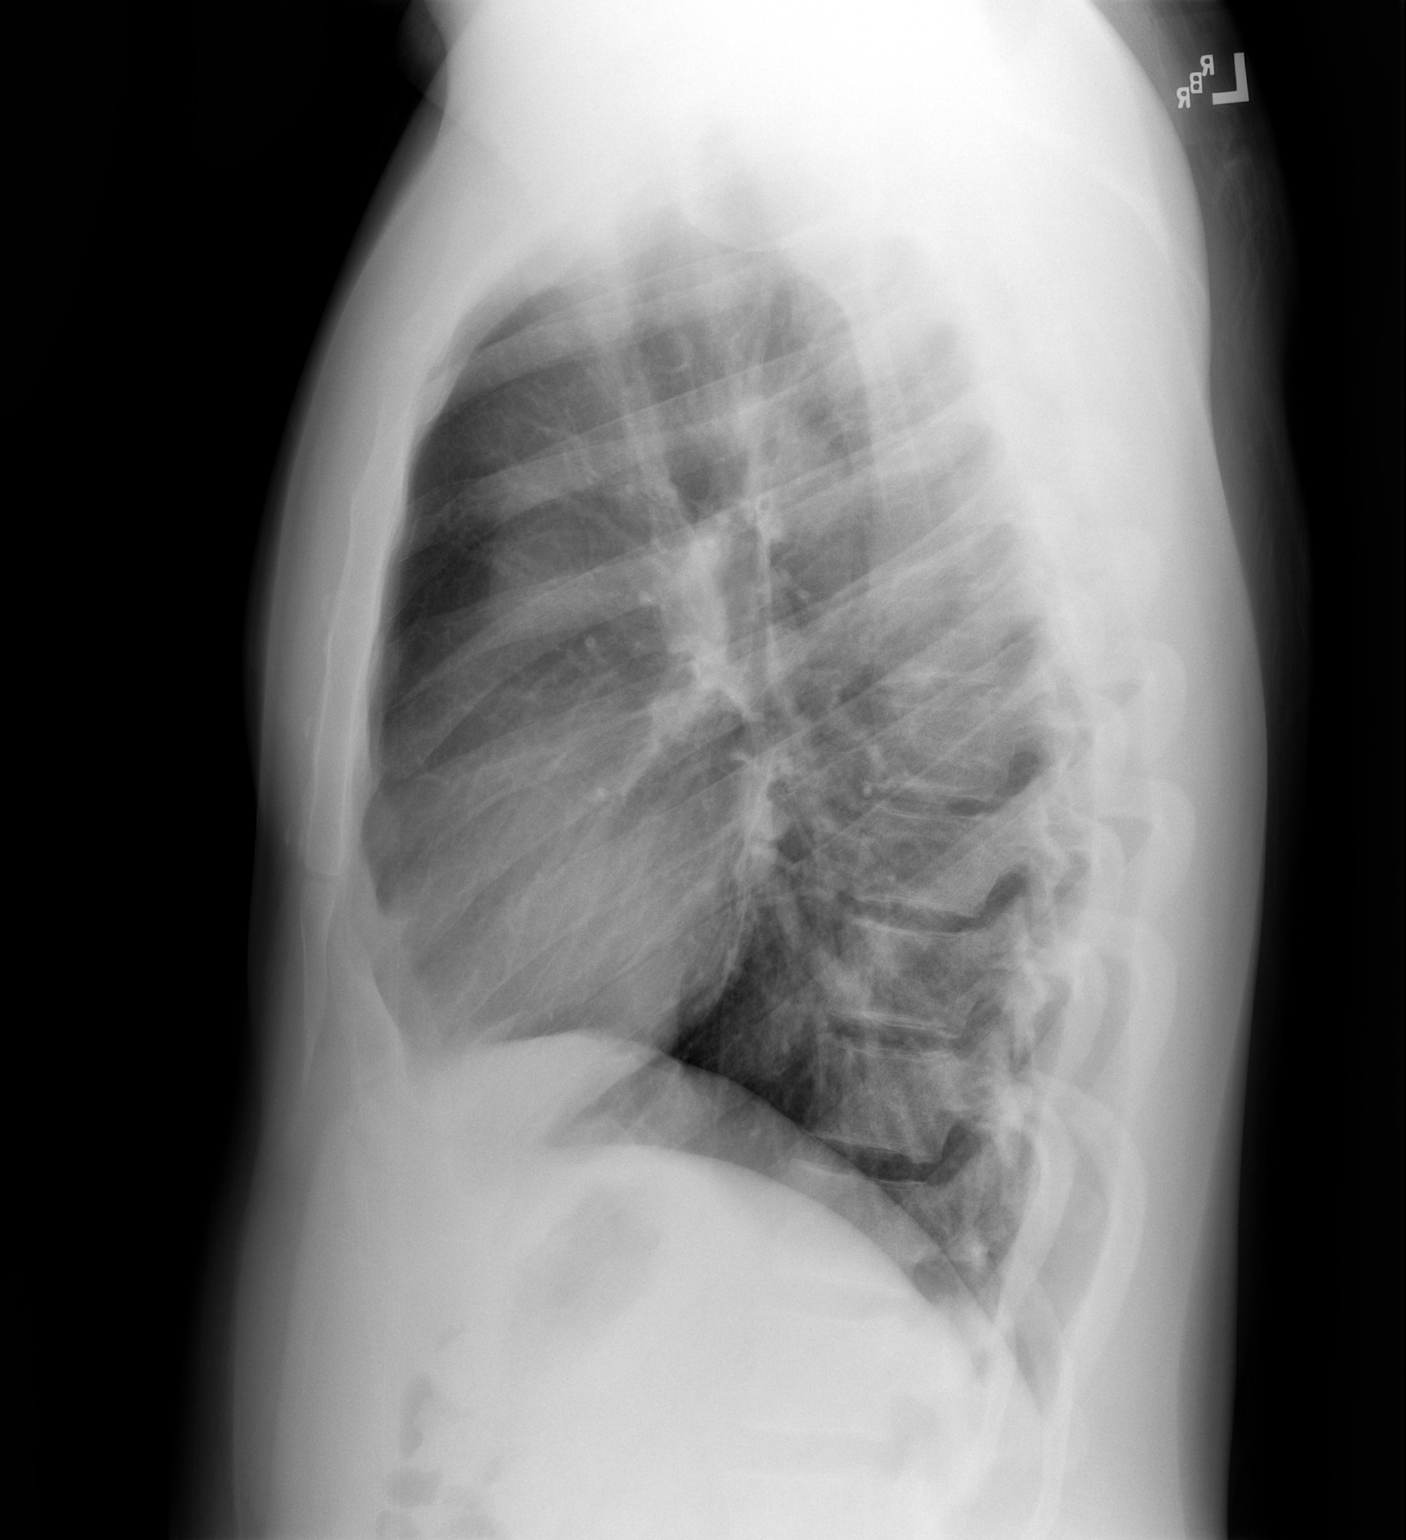

[2 of 2 positions shown; findings below may reference images not displayed]

FINDINGS: Heart and mediastinal contours are within normal limits. No focal
opacities or effusions. No acute bony abnormality.
IMPRESSION: No active cardiopulmonary disease.

## 2018-05-22 ENCOUNTER — Emergency Department (HOSPITAL_BASED_OUTPATIENT_CLINIC_OR_DEPARTMENT_OTHER): Admission: EM | Admit: 2018-05-22 | Discharge: 2018-05-22 | Discharge status: Against Medical Advice | Payer: Self-pay

## 2018-05-22 NOTE — ED Notes (Signed)
Pt called in waiting room and restrooms, no response.

## 2018-05-22 NOTE — ED Notes (Signed)
PT did not answer when called for triage x2

## 2024-04-02 ENCOUNTER — Other Ambulatory Visit: Payer: Self-pay

## 2024-04-02 ENCOUNTER — Encounter (HOSPITAL_BASED_OUTPATIENT_CLINIC_OR_DEPARTMENT_OTHER): Payer: Self-pay | Admitting: Urology

## 2024-04-02 ENCOUNTER — Emergency Department (HOSPITAL_BASED_OUTPATIENT_CLINIC_OR_DEPARTMENT_OTHER)
Admission: EM | Admit: 2024-04-02 | Discharge: 2024-04-02 | Discharge status: Against Medical Advice | Payer: Self-pay | Attending: Emergency Medicine | Admitting: Emergency Medicine

## 2024-04-02 DIAGNOSIS — J029 Acute pharyngitis, unspecified: Secondary | ICD-10-CM | POA: Insufficient documentation

## 2024-04-02 DIAGNOSIS — R509 Fever, unspecified: Secondary | ICD-10-CM | POA: Insufficient documentation

## 2024-04-02 DIAGNOSIS — M791 Myalgia, unspecified site: Secondary | ICD-10-CM | POA: Insufficient documentation

## 2024-04-02 DIAGNOSIS — Z5321 Procedure and treatment not carried out due to patient leaving prior to being seen by health care provider: Secondary | ICD-10-CM | POA: Insufficient documentation

## 2024-04-02 DIAGNOSIS — R0981 Nasal congestion: Secondary | ICD-10-CM | POA: Insufficient documentation

## 2024-04-02 LAB — GROUP A STREP BY PCR: Group A Strep by PCR: NOT DETECTED

## 2024-04-02 LAB — RESP PANEL BY RT-PCR (RSV, FLU A&B, COVID)  RVPGX2
Influenza A by PCR: NEGATIVE
Influenza B by PCR: NEGATIVE
Resp Syncytial Virus by PCR: NEGATIVE
SARS Coronavirus 2 by RT PCR: NEGATIVE

## 2024-04-02 NOTE — ED Notes (Signed)
 Pt called for in lobby x 2, per registration pt left

## 2024-04-02 NOTE — ED Triage Notes (Signed)
 Pt states concern for strep thoat  States sore throat since Friday  States body aches, fever, and congestion  Worsening since last night  White patches on throat
